# Patient Record
Sex: Female | Born: 1935 | Race: Black or African American | Hispanic: No | Marital: Single | Smoking: Never smoker
Health system: Southern US, Community
[De-identification: ages and names within clinical notes are randomized; demographics above are authoritative.]

## PROBLEM LIST (undated history)

## (undated) DIAGNOSIS — F0150 Vascular dementia without behavioral disturbance: Secondary | ICD-10-CM

## (undated) DIAGNOSIS — I509 Heart failure, unspecified: Secondary | ICD-10-CM

## (undated) DIAGNOSIS — F0390 Unspecified dementia without behavioral disturbance: Secondary | ICD-10-CM

## (undated) DIAGNOSIS — I10 Essential (primary) hypertension: Secondary | ICD-10-CM

## (undated) DIAGNOSIS — F419 Anxiety disorder, unspecified: Secondary | ICD-10-CM

## (undated) DIAGNOSIS — F32A Depression, unspecified: Secondary | ICD-10-CM

## (undated) DIAGNOSIS — K219 Gastro-esophageal reflux disease without esophagitis: Secondary | ICD-10-CM

## (undated) DIAGNOSIS — U071 COVID-19: Secondary | ICD-10-CM

## (undated) HISTORY — DX: Depression, unspecified: F32.A

## (undated) HISTORY — DX: COVID-19: U07.1

## (undated) HISTORY — DX: Anxiety disorder, unspecified: F41.9

## (undated) HISTORY — DX: Gastro-esophageal reflux disease without esophagitis: K21.9

## (undated) HISTORY — DX: Vascular dementia, unspecified severity, without behavioral disturbance, psychotic disturbance, mood disturbance, and anxiety: F01.50

## (undated) HISTORY — DX: Essential (primary) hypertension: I10

## (undated) HISTORY — DX: Heart failure, unspecified: I50.9

## (undated) HISTORY — DX: Unspecified dementia, unspecified severity, without behavioral disturbance, psychotic disturbance, mood disturbance, and anxiety: F03.90

---

## 2020-07-04 ENCOUNTER — Emergency Department (HOSPITAL_COMMUNITY): Payer: Medicare Other

## 2020-07-04 ENCOUNTER — Emergency Department (HOSPITAL_COMMUNITY)
Admission: EM | Admit: 2020-07-04 | Discharge: 2020-07-04 | Disposition: A | Discharge status: Home / Self Care | Payer: Medicare Other | Attending: Emergency Medicine | Admitting: Emergency Medicine

## 2020-07-04 ENCOUNTER — Encounter (HOSPITAL_COMMUNITY): Payer: Self-pay | Admitting: Emergency Medicine

## 2020-07-04 DIAGNOSIS — I509 Heart failure, unspecified: Secondary | ICD-10-CM | POA: Diagnosis not present

## 2020-07-04 DIAGNOSIS — I110 Hypertensive heart disease with heart failure: Secondary | ICD-10-CM | POA: Insufficient documentation

## 2020-07-04 DIAGNOSIS — W19XXXA Unspecified fall, initial encounter: Secondary | ICD-10-CM | POA: Diagnosis not present

## 2020-07-04 DIAGNOSIS — F0390 Unspecified dementia without behavioral disturbance: Secondary | ICD-10-CM | POA: Diagnosis not present

## 2020-07-04 DIAGNOSIS — M25552 Pain in left hip: Secondary | ICD-10-CM | POA: Insufficient documentation

## 2020-07-04 HISTORY — DX: Unspecified dementia, unspecified severity, without behavioral disturbance, psychotic disturbance, mood disturbance, and anxiety: F03.90

## 2020-07-04 HISTORY — DX: Essential (primary) hypertension: I10

## 2020-07-04 HISTORY — DX: Gastro-esophageal reflux disease without esophagitis: K21.9

## 2020-07-04 HISTORY — DX: Heart failure, unspecified: I50.9

## 2020-07-04 HISTORY — DX: Anxiety disorder, unspecified: F41.9

## 2020-07-04 NOTE — ED Notes (Addendum)
Pt assisted to standing position with 2 person assist, pt is able to bare weight without pain. Pt assisted back to bed.

## 2020-07-04 NOTE — ED Notes (Signed)
Called [facility] for transport to [facility]

## 2020-07-04 NOTE — ED Notes (Signed)
Staff at [facility] verbalize understanding of d/c instructions and follow up if needed, pt transported back to facility via [facility]

## 2020-07-04 NOTE — ED Triage Notes (Signed)
Pt sent by [facility] from [facility] after witnessed fall, no obvious injuries, c/o no pain. Staff request patient be sent to ED for evaluation.

## 2020-07-04 NOTE — ED Notes (Signed)
[facility] at bedside 

## 2020-07-04 NOTE — ED Notes (Signed)
Report called to [facility], transportation requested

## 2020-07-04 NOTE — ED Provider Notes (Signed)
[facility] EMERGENCY DEPARTMENT Provider Note   CSN: [id number] Arrival date & time: 07/04/20  0008     History Chief Complaint  Patient presents with  . Fall   Level 5 caveat due to dementia Dalores Weger is a 86 y.o. female.  The history is provided by the patient.  Fall This is a new problem. The problem occurs constantly. The problem has not changed since onset.Nothing aggravates the symptoms. Nothing relieves the symptoms.   Patient with history of dementia, CHF presents from nursing home after fall.  Per nursing staff, they heard a loud boom and found the patient on the floor.  I suspect she was try to get to the bathroom.  No obvious signs of head injury.  Patient was crying and reporting left hip pain.  By the time EMS arrived, patient was improved. Patient denies any complaints at this time.    Past Medical History:  Diagnosis Date  . Anxiety   . CHF (congestive heart failure) (HCC)   . Dementia (HCC)   . GERD (gastroesophageal reflux disease)   . Hypertension     There are no problems to display for this patient.   History reviewed. No pertinent surgical history.   OB History   No obstetric history on file.     No family history on file.  Social History   Tobacco Use  . Smoking status: Never Smoker  . Smokeless tobacco: Never Used  Substance Use Topics  . Alcohol use: Not Currently  . Drug use: Never    Home Medications Prior to Admission medications   Not on File    Allergies    Patient has no known allergies.  Review of Systems   Review of Systems  Unable to perform ROS: Dementia    Physical Exam Updated Vital Signs BP (!) 142/72 (BP Location: Left Arm)   Pulse 84   Temp (!) 97.3 F (36.3 C) (Tympanic)   Resp 18   SpO2 97%   Physical Exam CONSTITUTIONAL: Elderly but appears younger than stated age HEAD: Normocephalic/atraumatic, no visible trauma EYES: EOMI/PERRL ENMT: Mucous membranes moist NECK:  supple no meningeal signs SPINE/BACK:entire spine nontender CV: S1/S2 noted, no murmurs/rubs/gallops noted LUNGS: Lungs are clear to auscultation bilaterally, no apparent distress ABDOMEN: soft, nontender NEURO: Pt is awake/alert, moves all extremitiesx4.  No facial droop.   EXTREMITIES: pulses normal/equal, full ROM, pelvis stable, all other extremities/joints palpated/ranged and nontender SKIN: warm, color normal PSYCH: no abnormalities of mood noted, alert and oriented to situation  ED Results / Procedures / Treatments   Labs (all labs ordered are listed, but only abnormal results are displayed) Labs Reviewed - No data to display  EKG None  Radiology DG Hip [place] Pelvis 1 View Left  Result Date: 07/04/2020 CLINICAL DATA:  Initial evaluation for acute pain status post fall. EXAM: DG HIP (WITH OR WITHOUT PELVIS) 1V PORT LEFT COMPARISON:  Prior CT from 02/02/2020. FINDINGS: Apparent shortening of the left femoral neck, relatively stable from prior CT. No visible acute fracture or dislocation. Mild osteoarthritic changes about the left hip. Visualized bony pelvis intact. No visible soft tissue injury. Probable multiple calcified fibroids overlie the pelvis. IMPRESSION: No radiographic evidence for acute traumatic injury about the left hip. Electronically Signed   By: Rise Mu M.D.   On: 07/04/2020 01:40    Procedures Procedures  Medications Ordered in ED Medications - No data to display  ED Course  I have reviewed  the triage vital signs and the nursing notes.  Pertinent  imaging results that were available during my care of the patient were reviewed by me and considered in my medical decision making (see chart for details).    MDM Rules/Calculators/A&P                          Discussed with nursing staff at nursing home.  Plan to get x-ray of left hip, and if negative can be discharged back. they have attempted to contact family 1:49 AM X-ray  reviewed and shows no acute fracture.  Patient has been able to stand without pain. No other signs of acute traumatic injury.  Patient will be discharged back to facility Final Clinical Impression(s) / ED Diagnoses Final diagnoses:  Fall, initial encounter  Left hip pain    Rx / DC Orders ED Discharge Orders    None       Zadie Rhine, MD 07/04/20 0149

## 2020-11-15 NOTE — Progress Notes (Addendum)
Cardiology Office Note:    Date:  11/25/2020   ID:  Monique Petersen, DOB 10/01/1915, MRN [id number]  PCP:  Charlynne Pander, MD   [facility]  Cardiologist:  Christell Constant, MD  Advanced Practice Provider:  No care team member to display Electrophysiologist:  None       CC: CHF- NOS  Consulted for the evaluation of CHF at the behest of Charlynne Pander, MD  History of Present Illness:    Monique Petersen is a 87 y.o. female with a hx of HTN, CHF nos, and Vascular Dementia who presents for evaluation 11/26/20.  Patient notes that she is feeling terrible: through her exam she notes the following:  She is looking for her daughter who brought her and is worried about her (reference to her daughter [facility]; no one present for exam, transport done through [facility]), that she is upset that one of the men (presumed PT) has made her get up and down and do exercising when all she wants to do is sit.  Is alert to person and place; though needs re-orienting.  Denies CP/SOB; most other ROS leads back to the above.  Called Daughter and left message re-iterating her concerns and letting her know we were there. Reached son Marti Sleigh Page:  Notes that she has breathing problems; that based on prolonged family discussions, that her daughter Monique Petersen would make medical decisions for Ms. [name].  Records reviewed:Patient has a hx of vascular dementia and systolic HF NOS.  Has had persistent elevation in her BNP 600-> 800-> 1200 and has had a increase in her lasix:  From 40 mg PO BID-> 80 mg PO BID.  Unclear if patient is presently on lasix 120 PO BID at her facility.   Past Medical History:  Diagnosis Date  . Anxiety   . Anxiety   . CHF (congestive heart failure) (HCC)   . COVID-19   . Dementia (HCC)   . Depression   . GERD (gastroesophageal reflux disease)   . Hypertension   . Vascular dementia, uncomplicated (HCC)     No past surgical history on file.  Current  Medications: Current Meds  Medication Sig  . furosemide (LASIX) 80 MG tablet Take 80 mg by mouth.  Marland Kitchen LORazepam (ATIVAN) 0.5 MG tablet Take 0.5 mg by mouth every 8 (eight) hours.  . potassium chloride SA (KLOR-CON) 20 MEQ tablet Take 20 mEq by mouth 2 (two) times daily.  . tamsulosin (FLOMAX) 0.4 MG CAPS capsule Take 0.4 mg by mouth daily.  Marland Kitchen torsemide (DEMADEX) 100 MG tablet Take 100 mg by mouth daily.     Allergies:   Patient has no known allergies.   Social History   Socioeconomic History  . Marital status: Single    Spouse name: Not on file  . Number of children: Not on file  . Years of education: Not on file  . Highest education level: Not on file  Occupational History  . Not on file  Tobacco Use  . Smoking status: Never Smoker  . Smokeless tobacco: Never Used  Substance and Sexual Activity  . Alcohol use: Not Currently  . Drug use: Never  . Sexual activity: Not on file  Other Topics Concern  . Not on file  Social History Narrative  . Not on file   Social Determinants of Health   Financial Resource Strain: Not on file  Food Insecurity: Not on file  Transportation Needs: Not on file  Physical Activity: Not on file  Stress: Not on file  Social Connections: Not on file     Family History: Unable to obtain- patient has significant dementia and records do not address  ROS:   Please see the history of present illness.     All other systems reviewed and are negative thought with limited understanding by patient.  EKGs/Labs/Other Studies Reviewed:    The following studies were reviewed today:  EKG:  EKG is  ordered today.  The ekg ordered today demonstrates  11/25/20: SR rate 93 LVH IVCD  Recent Labs: No results found for requested labs within last 8760 hours.  Recent Lipid Panel No results found for: CHOL, TRIG, HDL, CHOLHDL, VLDL, LDLCALC, LDLDIRECT   Risk Assessment/Calculations:     N/A  Physical Exam:    VS:  BP 110/69   Pulse 93   Ht 5\' 3"  (1.6  m)   SpO2 99%     Wt Readings from Last 3 Encounters:  No data found for Wt     GEN: Then female, elderly in no acute distress HEENT: Normal; has false teeth and frequently takes them out during exam NECK: No JVD LYMPHATICS: No lymphadenopathy CARDIAC: RRR, soft I/VI systolic murmur, no rubs, gallops RESPIRATORY:  Clear to auscultation without rales, wheezing or rhonchi  ABDOMEN: Soft, non-tender, non-distended MUSCULOSKELETAL:  Non pitting edema; No deformity  SKIN: Warm and dry NEUROLOGIC:  Alert and oriented person, occasional to place through exam PSYCHIATRIC:  Anxious affect   ASSESSMENT:    1. Essential hypertension   2. HFrEF (heart failure with reduced ejection fraction) (HCC)   3. Vascular dementia without behavioral disturbance (HCC)    PLAN:    In order of problems listed above:  Heart Failure Reduced Ejection Fraction NOS Vascular Dementia HTN - keeping her on lasix 120 mg PO BID (the does we believe she is on) appears to keep her euvolemic - would check BMP, Mg on this regimen to make sure her kidney WNL - we have reached out to her facility to clarify these medications and are awaiting a call back - will not repeat echocardiogram as we have no plans to pursue aggressive therapy given age - If HTN becomes an issues, low threshold for ARB or Coreg - we have asked her facility if they have other clear past records we may be able to use for her assessment  Addendum: In internal on lasix 120 mg PO BID BNP 814, Creatinine 0.93, Potassium 4.1     Time Spent Directly with Patient:   I have spent a total of 90 with the patient reviewing notes, EKGs, labs and examining the patient as well as establishing an assessment and plan that was discussed personally with the patient.  > 50% of time was spent in direct patient care and family. - called daughter - called son Monique Petersen, on speaker phone with patient, and discussed brief plan (to keep her euvolemic) -  patient was unaccompanied to visit and would try to leave throughout visit; arrange safe monitoring while at [facility]:  Patient will need to be accompanied for future visits - reached out to [facility] as above  Three months follow up unless new symptoms or abnormal test results warranting change in plan  Would be reasonable for  APP Follow up            Medication Adjustments/Labs and Tests Ordered: Current medicines are reviewed at length with the patient today.  Concerns regarding medicines are outlined above.  No orders of the  defined types were placed in this encounter.  No orders of the defined types were placed in this encounter.   Patient Instructions  Medication Instructions:  Your physician recommends that you continue on your current medications as directed. Please refer to the Current Medication list given to you today.  *If you need a refill on your cardiac medications before your next appointment, please call your pharmacy*   Lab Work: BNP and BMP in 7-10 days please fax results to Dr. Izora Ribas [phone] If you have labs (blood work) drawn today and your tests are completely normal, you will receive your results only by: Marland Kitchen MyChart Message (if you have MyChart) OR . A paper copy in the mail If you have any lab test that is abnormal or we need to change your treatment, we will call you to review the results.   Testing/Procedures: NONE   Follow-Up: At [facility], you and your health needs are our priority.  As part of our continuing mission to provide you with exceptional heart care, we have created designated Provider Care Teams.  These Care Teams include your primary Cardiologist (physician) and Advanced Practice Providers (APPs -  Physician Assistants and Nurse Practitioners) who all work together to provide you with the care you need, when you need it.  We recommend signing up for the patient portal called "MyChart".  Sign up information is provided  on this After Visit Summary.  MyChart is used to connect with patients for Virtual Visits (Telemedicine).  Patients are able to view lab/test results, encounter notes, upcoming appointments, etc.  Non-urgent messages can be sent to your provider as well.   To learn more about what you can do with MyChart, go to [email or web address].    Your next appointment:   3-4 month(s)  PLEASE HAVE SOMEONE ACCOMPANY **  The format for your next appointment:   In Person  Provider:   You may see Christell Constant, MD or one of the following Advanced Practice Providers on your designated Care Team:    Ronie Spies, PA-C  Jacolyn Reedy, PA-C         Signed, Christell Constant, MD  11/25/2020 12:43 PM    [facility]

## 2020-11-25 ENCOUNTER — Encounter: Payer: Self-pay | Admitting: Internal Medicine

## 2020-11-25 ENCOUNTER — Other Ambulatory Visit: Payer: Self-pay

## 2020-11-25 ENCOUNTER — Telehealth: Payer: Self-pay

## 2020-11-25 ENCOUNTER — Ambulatory Visit (INDEPENDENT_AMBULATORY_CARE_PROVIDER_SITE_OTHER): Payer: Medicare Other | Admitting: Internal Medicine

## 2020-11-25 VITALS — BP 110/69 | HR 93 | Ht 63.0 in

## 2020-11-25 DIAGNOSIS — F0150 Vascular dementia without behavioral disturbance: Secondary | ICD-10-CM

## 2020-11-25 DIAGNOSIS — I5020 Unspecified systolic (congestive) heart failure: Secondary | ICD-10-CM | POA: Diagnosis not present

## 2020-11-25 DIAGNOSIS — I10 Essential (primary) hypertension: Secondary | ICD-10-CM

## 2020-11-25 NOTE — Telephone Encounter (Signed)
Called facility to get clarification on medications taken.  Spoke with nurse April, she expressed that she was having computer issues and could not give me information needed. She would have someone call me back.  I also informed her to put in patient chart that she is not to come to appointments alone d/t age and cognition.  Patient noted crying asking for daughter and attempting to wander.  Nurse April reported that she would put a note in patient chart.

## 2020-11-25 NOTE — Patient Instructions (Signed)
Medication Instructions:  Your physician recommends that you continue on your current medications as directed. Please refer to the Current Medication list given to you today.  *If you need a refill on your cardiac medications before your next appointment, please call your pharmacy*   Lab Work: BNP and BMP in 7-10 days please fax results to Dr. Izora Ribas [phone] If you have labs (blood work) drawn today and your tests are completely normal, you will receive your results only by: Marland Kitchen MyChart Message (if you have MyChart) OR . A paper copy in the mail If you have any lab test that is abnormal or we need to change your treatment, we will call you to review the results.   Testing/Procedures: NONE   Follow-Up: At [facility], you and your health needs are our priority.  As part of our continuing mission to provide you with exceptional heart care, we have created designated Provider Care Teams.  These Care Teams include your primary Cardiologist (physician) and Advanced Practice Providers (APPs -  Physician Assistants and Nurse Practitioners) who all work together to provide you with the care you need, when you need it.  We recommend signing up for the patient portal called "MyChart".  Sign up information is provided on this After Visit Summary.  MyChart is used to connect with patients for Virtual Visits (Telemedicine).  Patients are able to view lab/test results, encounter notes, upcoming appointments, etc.  Non-urgent messages can be sent to your provider as well.   To learn more about what you can do with MyChart, go to [email or web address].    Your next appointment:   3-4 month(s)  PLEASE HAVE SOMEONE ACCOMPANY **  The format for your next appointment:   In Person  Provider:   You may see Christell Constant, MD or one of the following Advanced Practice Providers on your designated Care Team:    Ronie Spies, PA-C  Jacolyn Reedy, PA-C

## 2020-11-25 NOTE — Telephone Encounter (Signed)
Patient's daughter is returning call.

## 2020-11-26 NOTE — Addendum Note (Signed)
Addended by: Winifred Olive on: 11/26/2020 03:22 PM   Modules accepted: Orders

## 2020-12-20 NOTE — Telephone Encounter (Signed)
Spoke with staff at [facility] in regards to 3 month fu.  The contact person Dennie Bible) for scheduling is out of the office.  I will attempt to call back at later date 8-4pm to schedule appointment.

## 2020-12-21 NOTE — Telephone Encounter (Signed)
Attempted to speak with scheduler Pat for 3 month fu.  However, I was left on hold for greater than 3 minutes will attempt at another date.

## 2021-05-03 NOTE — Telephone Encounter (Signed)
Called Pat scheduler for facility [facility] scheduled for 07/26/21 at 9 am.  Dennie Bible advised that she noted that pt is to have a CNA with her at [facility].

## 2021-06-22 ENCOUNTER — Inpatient Hospital Stay (HOSPITAL_COMMUNITY): Payer: Medicare Other

## 2021-06-22 ENCOUNTER — Inpatient Hospital Stay (HOSPITAL_COMMUNITY)
Admission: EM | Admit: 2021-06-22 | Discharge: 2021-06-26 | DRG: 689 | Disposition: A | Discharge status: Skilled Nursing Facility | Payer: Medicare Other | Source: Skilled Nursing Facility | Attending: Internal Medicine | Admitting: Internal Medicine

## 2021-06-22 ENCOUNTER — Emergency Department (HOSPITAL_COMMUNITY): Payer: Medicare Other

## 2021-06-22 ENCOUNTER — Other Ambulatory Visit: Payer: Self-pay

## 2021-06-22 ENCOUNTER — Encounter (HOSPITAL_COMMUNITY): Payer: Self-pay | Admitting: Internal Medicine

## 2021-06-22 DIAGNOSIS — E870 Hyperosmolality and hypernatremia: Secondary | ICD-10-CM | POA: Diagnosis present

## 2021-06-22 DIAGNOSIS — F0284 Dementia in other diseases classified elsewhere, unspecified severity, with anxiety: Secondary | ICD-10-CM | POA: Diagnosis present

## 2021-06-22 DIAGNOSIS — E872 Acidosis, unspecified: Secondary | ICD-10-CM | POA: Diagnosis present

## 2021-06-22 DIAGNOSIS — R404 Transient alteration of awareness: Secondary | ICD-10-CM

## 2021-06-22 DIAGNOSIS — K219 Gastro-esophageal reflux disease without esophagitis: Secondary | ICD-10-CM | POA: Diagnosis present

## 2021-06-22 DIAGNOSIS — Z96641 Presence of right artificial hip joint: Secondary | ICD-10-CM | POA: Diagnosis present

## 2021-06-22 DIAGNOSIS — N179 Acute kidney failure, unspecified: Secondary | ICD-10-CM | POA: Diagnosis present

## 2021-06-22 DIAGNOSIS — N3000 Acute cystitis without hematuria: Secondary | ICD-10-CM | POA: Diagnosis present

## 2021-06-22 DIAGNOSIS — F32A Depression, unspecified: Secondary | ICD-10-CM | POA: Diagnosis present

## 2021-06-22 DIAGNOSIS — I110 Hypertensive heart disease with heart failure: Secondary | ICD-10-CM | POA: Diagnosis present

## 2021-06-22 DIAGNOSIS — I509 Heart failure, unspecified: Secondary | ICD-10-CM

## 2021-06-22 DIAGNOSIS — Z515 Encounter for palliative care: Secondary | ICD-10-CM | POA: Diagnosis not present

## 2021-06-22 DIAGNOSIS — F0154 Vascular dementia, unspecified severity, with anxiety: Secondary | ICD-10-CM | POA: Diagnosis present

## 2021-06-22 DIAGNOSIS — R54 Age-related physical debility: Secondary | ICD-10-CM | POA: Diagnosis present

## 2021-06-22 DIAGNOSIS — I5022 Chronic systolic (congestive) heart failure: Secondary | ICD-10-CM | POA: Diagnosis present

## 2021-06-22 DIAGNOSIS — E860 Dehydration: Secondary | ICD-10-CM | POA: Diagnosis present

## 2021-06-22 DIAGNOSIS — N390 Urinary tract infection, site not specified: Secondary | ICD-10-CM | POA: Diagnosis present

## 2021-06-22 DIAGNOSIS — G309 Alzheimer's disease, unspecified: Secondary | ICD-10-CM | POA: Diagnosis present

## 2021-06-22 DIAGNOSIS — G9341 Metabolic encephalopathy: Secondary | ICD-10-CM | POA: Diagnosis present

## 2021-06-22 DIAGNOSIS — Z7189 Other specified counseling: Secondary | ICD-10-CM | POA: Diagnosis not present

## 2021-06-22 DIAGNOSIS — R410 Disorientation, unspecified: Secondary | ICD-10-CM

## 2021-06-22 DIAGNOSIS — F411 Generalized anxiety disorder: Secondary | ICD-10-CM | POA: Diagnosis present

## 2021-06-22 DIAGNOSIS — F419 Anxiety disorder, unspecified: Secondary | ICD-10-CM | POA: Diagnosis present

## 2021-06-22 DIAGNOSIS — R627 Adult failure to thrive: Secondary | ICD-10-CM | POA: Diagnosis present

## 2021-06-22 DIAGNOSIS — Z66 Do not resuscitate: Secondary | ICD-10-CM | POA: Diagnosis present

## 2021-06-22 DIAGNOSIS — I5020 Unspecified systolic (congestive) heart failure: Secondary | ICD-10-CM | POA: Diagnosis not present

## 2021-06-22 DIAGNOSIS — Z20822 Contact with and (suspected) exposure to covid-19: Secondary | ICD-10-CM | POA: Diagnosis present

## 2021-06-22 LAB — COMPREHENSIVE METABOLIC PANEL
ALT: 24 U/L (ref 0–44)
AST: 20 U/L (ref 15–41)
Albumin: 3.8 g/dL (ref 3.5–5.0)
Alkaline Phosphatase: 80 U/L (ref 38–126)
Anion gap: 14 (ref 5–15)
BUN: 29 mg/dL — ABNORMAL HIGH (ref 8–23)
CO2: 22 mmol/L (ref 22–32)
Calcium: 9.8 mg/dL (ref 8.9–10.3)
Chloride: 106 mmol/L (ref 98–111)
Creatinine, Ser: 1.42 mg/dL — ABNORMAL HIGH (ref 0.44–1.00)
GFR, Estimated: 32 mL/min — ABNORMAL LOW (ref >60)
Glucose, Bld: 135 mg/dL — ABNORMAL HIGH (ref 70–99)
Potassium: 4.1 mmol/L (ref 3.5–5.1)
Sodium: 142 mmol/L (ref 135–145)
Total Bilirubin: 2.1 mg/dL — ABNORMAL HIGH (ref 0.3–1.2)
Total Protein: 7.9 g/dL (ref 6.5–8.1)

## 2021-06-22 LAB — URINALYSIS, ROUTINE W REFLEX MICROSCOPIC
Bilirubin Urine: NEGATIVE
Glucose, UA: NEGATIVE mg/dL
Ketones, ur: NEGATIVE mg/dL
Nitrite: NEGATIVE
Protein, ur: 30 mg/dL — AB
RBC / HPF: >50 RBC/hpf — ABNORMAL HIGH (ref 0–5)
Specific Gravity, Urine: 1.015 (ref 1.005–1.030)
WBC, UA: >50 WBC/hpf — ABNORMAL HIGH (ref 0–5)
pH: 5 (ref 5.0–8.0)

## 2021-06-22 LAB — CBC WITH DIFFERENTIAL/PLATELET
Abs Immature Granulocytes: 0.02 10*3/uL (ref 0.00–0.07)
Basophils Absolute: 0.1 10*3/uL (ref 0.0–0.1)
Basophils Relative: 1 %
Eosinophils Absolute: 0 10*3/uL (ref 0.0–0.5)
Eosinophils Relative: 0 %
HCT: 47.3 % — ABNORMAL HIGH (ref 36.0–46.0)
Hemoglobin: 15.6 g/dL — ABNORMAL HIGH (ref 12.0–15.0)
Immature Granulocytes: 0 %
Lymphocytes Relative: 26 %
Lymphs Abs: 1.9 10*3/uL (ref 0.7–4.0)
MCH: 30.7 pg (ref 26.0–34.0)
MCHC: 33 g/dL (ref 30.0–36.0)
MCV: 93.1 fL (ref 80.0–100.0)
Monocytes Absolute: 0.7 10*3/uL (ref 0.1–1.0)
Monocytes Relative: 9 %
Neutro Abs: 4.7 10*3/uL (ref 1.7–7.7)
Neutrophils Relative %: 64 %
Platelets: 214 10*3/uL (ref 150–400)
RBC: 5.08 MIL/uL (ref 3.87–5.11)
RDW: 13 % (ref 11.5–15.5)
WBC: 7.3 10*3/uL (ref 4.0–10.5)
nRBC: 0 % (ref 0.0–0.2)

## 2021-06-22 LAB — I-STAT VENOUS BLOOD GAS, ED
Acid-Base Excess: 2 mmol/L (ref 0.0–2.0)
Bicarbonate: 24.3 mmol/L (ref 20.0–28.0)
Calcium, Ion: 1.13 mmol/L — ABNORMAL LOW (ref 1.15–1.40)
HCT: 48 % — ABNORMAL HIGH (ref 36.0–46.0)
Hemoglobin: 16.3 g/dL — ABNORMAL HIGH (ref 12.0–15.0)
O2 Saturation: 81 %
Potassium: 4.2 mmol/L (ref 3.5–5.1)
Sodium: 144 mmol/L (ref 135–145)
TCO2: 25 mmol/L (ref 22–32)
pCO2, Ven: 31.5 mmHg — ABNORMAL LOW (ref 44.0–60.0)
pH, Ven: 7.495 — ABNORMAL HIGH (ref 7.250–7.430)
pO2, Ven: 41 mmHg (ref 32.0–45.0)

## 2021-06-22 LAB — ETHANOL: Alcohol, Ethyl (B): <10 mg/dL (ref <10)

## 2021-06-22 LAB — RESP PANEL BY RT-PCR (FLU A&B, COVID) ARPGX2
Influenza A by PCR: NEGATIVE
Influenza B by PCR: NEGATIVE
SARS Coronavirus 2 by RT PCR: NEGATIVE

## 2021-06-22 LAB — CBG MONITORING, ED: Glucose-Capillary: 151 mg/dL — ABNORMAL HIGH (ref 70–99)

## 2021-06-22 LAB — AMMONIA: Ammonia: 15 umol/L (ref 9–35)

## 2021-06-22 LAB — LACTIC ACID, PLASMA: Lactic Acid, Venous: 1.9 mmol/L (ref 0.5–1.9)

## 2021-06-22 MED ORDER — MORPHINE SULFATE (PF) 2 MG/ML IV SOLN
2.0000 mg | Freq: Once | INTRAVENOUS | Status: AC
  Administered 2021-06-22: 2 mg via INTRAVENOUS
  Filled 2021-06-22: qty 1

## 2021-06-22 MED ORDER — SODIUM CHLORIDE 0.9 % IV SOLN
1.0000 g | Freq: Once | INTRAVENOUS | Status: AC
  Administered 2021-06-22: 1 g via INTRAVENOUS
  Filled 2021-06-22: qty 10

## 2021-06-22 MED ORDER — SODIUM CHLORIDE 0.9 % IV SOLN
1.0000 g | INTRAVENOUS | Status: DC
  Administered 2021-06-23 – 2021-06-25 (×3): 1 g via INTRAVENOUS
  Filled 2021-06-22 (×3): qty 10

## 2021-06-22 MED ORDER — LACTATED RINGERS IV SOLN: INTRAVENOUS | Status: AC

## 2021-06-22 MED ORDER — SODIUM CHLORIDE 0.9 % IV BOLUS
1000.0000 mL | Freq: Once | INTRAVENOUS | Status: AC
  Administered 2021-06-22: 1000 mL via INTRAVENOUS

## 2021-06-22 MED ORDER — ACETAMINOPHEN 325 MG PO TABS: 650.0000 mg | ORAL_TABLET | Freq: Four times a day (QID) | ORAL | Status: DC | PRN

## 2021-06-22 MED ORDER — ONDANSETRON HCL 4 MG/2ML IJ SOLN
4.0000 mg | Freq: Once | INTRAMUSCULAR | Status: AC
  Administered 2021-06-22: 4 mg via INTRAVENOUS
  Filled 2021-06-22: qty 2

## 2021-06-22 MED ORDER — ACETAMINOPHEN 650 MG RE SUPP: 650.0000 mg | Freq: Four times a day (QID) | RECTAL | Status: DC | PRN

## 2021-06-22 NOTE — H&P (Signed)
History and Physical    PLEASE NOTE THAT DRAGON DICTATION SOFTWARE WAS USED IN THE CONSTRUCTION OF THIS NOTE.   Monique Petersen GYB:[id number] DOB: 31-Jul-1916 DOA: 06/22/2021  PCP: Caprice Renshaw, MD  Patient coming from: nursing home  I have personally briefly reviewed patient's old medical records in [place]  Chief Complaint: altered mental status  HPI: Monique Petersen is a 87 y.o. female with medical history significant for chronic systolic heart failure, GAD, dementia, depression, who is admitted to [facility] on 06/22/2021 with acute metabolic encephalopathy after presenting from SNF to [facility] ED for evaluation of altered mental status.     In the setting of patient's presenting altered mental status, the following history if provided by SNF staff, my discussions with EDP, and via chart review.   Per staff at SNF, patient has exhibited two days of progressive somnolence, diminished interactions, and decline in oral intake, relative to baseline mental / functional status at which patient is reportedly alert and able to feed herself. She carries a diagnosis of vascular dementia.   Per chart review, patient has a history of chronic systolic heart failure, but with overt specific prior echocardiogram results identified. She last say [facility] cardiology in April '22, at which time Dr. Joan Mayans noted, per chart review, a history of chronic systolic heart failure, but also noted no currently available specific prior echo results. Dr. Joan Mayans felt that the patient was relative euvolumeic at time of this encounter in April '22, and made no changes to her reported existing diuretic regimen that consistented of Lasix 120 mg PO BID. It also appears that pt takes Spironolactone. Dr. Joan Mayans also noted pt's chronically elevated BNP values, trending up from 600 to 800 to 1200 leading up to the April 2022 outpatient cards appointment.   She also has a documented history of GAD, for  which she is on scheduled Ativan 0.5 mg PO TID as outpatient. Additional outpatient medications notable for the following: scheduled Seroquel BID and daily Zoloft.     ED Course:  Vital signs in the ED were notable for the following: Afebrile; initial heart rate 101, which decreased into the range of 76-90 following interval IV fluids, as above; blood pressure 95/64 -160/76; respiratory rate 16-22, oxygen saturation 95 to 98% on room air.  Labs were notable for the following: CBG 151; CMP notable for the following: Sodium 142, creatinine 1.42 relative to most recent prior serum creatinine data point of 0.93 on 11/29/2020, BUN to creatinine ratio 28.4, calcium 9.8, albumin 3.8, total bilirubin 2.1, otherwise, liver enzymes found to be within normal limits; pneumonia 15, serum ethanol level less than 10.  CBC notable for white blood cell count 7300.  Lactic acid 1.9.  Urinalysis was associated with a hazy appearing specimen it was notable for greater than 50 white blood cells, moderate leukocyte esterase, no squamous epithelial cells, was positive for hyaline cast, and showed 30 protein.  COVID-19/influenza PCR checked in the ED today sent me negative.  Blood cultures x2 were collected prior to initiation of IV antibiotics.  Imaging and additional notable ED work-up: EKG showed sinus rhythm with PACs, heart rate 94, LVH with interventricular conduction delay, QTC 518 ms, and no evidence of interval T wave changes.  Evaluation of presenting chest x-ray is limited by patient rotation, but within these confines,.  Showed no evidence of acute cardiopulmonary process.  CT head showed no evidence of acute intracranial process.  CT renal stone was ordered by EDP, with result currently  pending.  Of note, EDP attempted to reach multiple family members, including son and daughter, but was unable to contact these individuals or any other individual listed as point of contact for the patient.   While in the ED, the  following were administered just received Rocephin, and 1 L normal saline bolus.  Initially there is concern the patient may be in some discomfort, prompting administration of morphine 2 mg IV x1     Review of Systems: As per HPI otherwise 10 point review of systems negative.   Past Medical History:  Diagnosis Date   Anxiety    Anxiety    CHF (congestive heart failure) ([place])    COVID-19    Dementia (HCC)    Depression    GERD (gastroesophageal reflux disease)    Hypertension    Vascular dementia, uncomplicated ([place])     History reviewed. No pertinent surgical history.  Social History:  reports that she has never smoked. She has never used smokeless tobacco. She reports that she does not currently use alcohol. She reports that she does not use drugs.   No Known Allergies  History reviewed. No pertinent family history.   Prior to Admission medications   Medication Sig Start Date End Date Taking? Authorizing Provider  acetaminophen (TYLENOL) 325 MG tablet Take 650 mg by mouth in the morning and at bedtime.   Yes [provider]  docusate sodium (COLACE) 100 MG capsule Take 100 mg by mouth daily.   Yes [provider]  LORazepam (ATIVAN) 0.5 MG tablet Take 0.5 mg by mouth every 8 (eight) hours.   Yes [provider]  melatonin 3 MG TABS tablet Take 3 mg by mouth at bedtime.   Yes [provider]  potassium chloride SA (KLOR-CON) 20 MEQ tablet Take 20 mEq by mouth 2 (two) times daily.   Yes [provider]  QUEtiapine (SEROQUEL) 50 MG tablet Take 50 mg by mouth 2 (two) times daily.   Yes [provider]  Sennosides 8.6 MG CAPS Take 8.6 mg by mouth daily.   Yes [provider]  sertraline (ZOLOFT) 25 MG tablet Take 25 mg by mouth daily.   Yes [provider]  spironolactone (ALDACTONE) 25 MG tablet Take 25 mg by mouth daily.   Yes [provider]  tamsulosin (FLOMAX) 0.4 MG CAPS capsule Take 0.4 mg by  mouth daily. 08/31/20  Yes [provider]  furosemide (LASIX) 80 MG tablet Take 120 mg by mouth 2 (two) times daily.    [provider]     Objective    Physical Exam: Vitals:   06/22/21 2004 06/22/21 2100 06/22/21 2144 06/22/21 2256  BP: 113/67 116/76 136/69 (!) 98/55  Pulse: 94 90 97 83  Resp: 14 19 (!) 32 (!) 21  Temp:      TempSrc:      SpO2: 98% 96% 95% 95%    General: appears to be stated age; somnolent; briefly opens eyes to verbal stimuli; unable to follow instructions; non-verbal Skin: warm, dry, no rash Head:  AT/[place] Mouth:  Oral mucosa membranes appear dry, normal dentition Neck: supple; trachea midline Heart:  RRR; did not appreciate any M/R/G Lungs: CTAB, did not appreciate any wheezes, rales, or rhonchi Abdomen: + BS; soft, ND Vascular: 2+ pedal pulses b/l; 2+ radial pulses b/l Extremities: no peripheral edema, no muscle wasting Neuro: In the setting of the patient's current mental status and associated inability to follow instructions, unable to perform full neurologic exam  at this time.  As such, assessment of strength, sensation, and cranial nerves is limited at this time. Patient noted to spontaneously move all 4 extremities. No tremors.     Labs on Admission: I have personally reviewed following labs and imaging studies  CBC: Recent Labs  Lab 06/22/21 1945 06/22/21 1959  WBC 7.3  --   NEUTROABS 4.7  --   HGB 15.6* 16.3*  HCT 47.3* 48.0*  MCV 93.1  --   PLT 214  --    Basic Metabolic Panel: Recent Labs  Lab 06/22/21 1945 06/22/21 1959  NA 142 144  K 4.1 4.2  CL 106  --   CO2 22  --   GLUCOSE 135*  --   BUN 29*  --   CREATININE 1.42*  --   CALCIUM 9.8  --    GFR: CrCl cannot be calculated (Unknown ideal weight.). Liver Function Tests: Recent Labs  Lab 06/22/21 1945  AST 20  ALT 24  ALKPHOS 80  BILITOT 2.1*  PROT 7.9  ALBUMIN 3.8   No results for input(s): LIPASE, AMYLASE in the last 168 hours. Recent Labs   Lab 06/22/21 1945  AMMONIA 15   Coagulation Profile: No results for input(s): INR, PROTIME in the last 168 hours. Cardiac Enzymes: No results for input(s): CKTOTAL, CKMB, CKMBINDEX, TROPONINI in the last 168 hours. BNP (last 3 results) No results for input(s): PROBNP in the last 8760 hours. HbA1C: No results for input(s): HGBA1C in the last 72 hours. CBG: Recent Labs  Lab 06/22/21 1923  GLUCAP 151*   Lipid Profile: No results for input(s): CHOL, HDL, LDLCALC, TRIG, CHOLHDL, LDLDIRECT in the last 72 hours. Thyroid Function Tests: No results for input(s): TSH, T4TOTAL, FREET4, T3FREE, THYROIDAB in the last 72 hours. Anemia Panel: No results for input(s): VITAMINB12, FOLATE, FERRITIN, TIBC, IRON, RETICCTPCT in the last 72 hours. Urine analysis:    Component Value Date/Time   COLORURINE AMBER (A) 06/22/2021 2151   APPEARANCEUR HAZY (A) 06/22/2021 2151   LABSPEC 1.015 06/22/2021 2151   PHURINE 5.0 06/22/2021 2151   GLUCOSEU NEGATIVE 06/22/2021 2151   HGBUR LARGE (A) 06/22/2021 2151   BILIRUBINUR NEGATIVE 06/22/2021 2151   [place] 06/22/2021 2151   PROTEINUR 30 (A) 06/22/2021 2151   NITRITE NEGATIVE 06/22/2021 2151   LEUKOCYTESUR MODERATE (A) 06/22/2021 2151    Radiological Exams on Admission: DG Chest 1 View  Result Date: 06/22/2021 CLINICAL DATA:  Not eating for 2 days. EXAM: CHEST  1 VIEW COMPARISON:  Chest x-ray 09/02/2019. FINDINGS: Examination is limited secondary to patient rotation. The cardiomediastinal silhouette is grossly unchanged. The heart is enlarged the aorta is tortuous. There is no lung infiltrate, pleural effusion or pneumothorax. No acute fractures are seen. IMPRESSION: 1. Limited examination.  No definite acute cardiopulmonary process. Electronically Signed   By: Ronney Asters M.D.   On: 06/22/2021 19:18   CT HEAD WO CONTRAST  Result Date: 06/22/2021 CLINICAL DATA:  Delirium EXAM: CT HEAD WITHOUT CONTRAST TECHNIQUE: Contiguous axial images  were obtained from the base of the skull through the vertex without intravenous contrast. COMPARISON:  None. FINDINGS: Brain: Image quality degraded by significant motion. Moderate atrophy. Chronic microvascular ischemic change in the white matter and right basal ganglia. Negative for acute infarct, hemorrhage, mass Vascular: Negative for hyperdense vessel Skull: Negative Sinuses/Orbits: Negative Other: None IMPRESSION: Motion degraded study.  No acute abnormality Atrophy and chronic ischemia.  Chronic infarct right basal ganglia. Electronically Signed   By: Franchot Gallo M.D.  On: 06/22/2021 19:24     EKG: Independently reviewed, with result as described above.    Assessment/Plan   Principal Problem:   Acute metabolic encephalopathy Active Problems:   HFrEF (heart failure with reduced ejection fraction) (HCC)   Acute lower UTI   AKI (acute kidney injury) ([place])   Anxiety     #) Acute metabolic encephalopathy: 2 days of progressive somnolence, diminished interaction relative to her reported baseline in the context of underlying dementia at which she is reportedly alert and able to feed herself. Suspect that acute encephalopathy is metabolic in nature in setting of suspected UTI, as further detailed below, with additional metabolic contributions from ensuing relative dehydration resulting in AKI, confounding the presence of multiple scheduled central-acting medications as an outpatient. No overt additional underlying infxn at this time, although review of CXR was somewhat limited by pt rotation. Will further evaluate with procalcitonin. CT head showed no acute process.   Plan: further eval/managaement of UTI, as below, including Rocephin. Additional management of aki and suspected dehydration via very gentle IVF's in form of LR at 50 cc/hr x 8 hours. Keep npo until mental status improves such that patient can participate in and pass nursing bedside swallow screen, including continued holding of  outpatient scheduled central-acting meds. Check cpk, tsh, mma, uds. CMP/CBC in the AM. Follow for result fo CT renal stone ordered by EDP, with result currently pending.     #) Acute cystitis: in setting of presenting acute encephalopathy, presenting UA appears consistent with UTI. Without fever or leukocytosis, SIRS criteria not met for sepsis. LA non-elevated at 1.9. Blood cx's x 2 collected prior to receiving dose of Rocephin. CT renal stone , as above, is currently pending.   Plan: follow for results of blood cx's x2. Add-on urine cx to existing urine specimen. Continue Rocephin. Repeat CBC in the AM.       #) AKI: presenting Cr 1.42 relative to most recent prior value of 0.93 on 11/29/20. Appears prerenal in nature due to relative dehydration given diminished PO intake over last 48 hours, which appears c/w UA results that are notable for presence of hyaline casts as well as 30 protein.   Plan: gentle IVF in form of LR 50 cc/hr x 8 hr, while closing monitoring for evidence of volume overload given reported history of chronic diastolic HF. Monitor strict I&O's, daily weights. Attempt to avoid nephrotoxic agents. Add-on random urine Na/Cr. Bmp in the AM. Follow for cpk result.       #) GAD: on scheduled Ativan as outpatient. In setting of presenting acute encephalopathy, will hold outpatient Ativan for now. Also on Zoloft as outpatient.   Plan: hold home Zoloft and scheduled Ativan for now.       #) chronic systolic heart failure: per chart review, has a history of such, but without specific overt prior echo results identified or alluded to, including per review of most recent outpatient cardiology note from April '22, as described above. Outpatient diuretic regimen rerpotedly consists of Lasix 120 mg PO BID plus spirolonactone. Clinically, suspected to be mildly dehydrated at present following reduced PO intake over last 48 hours, with CXR showing no acute process, within confines of  suboptimal image quality, as described above. Will further assess with BNP, noting chronic elevation of such, as quantified above.   Plan: monitor strict I&O's, daily weights. Check bnp. Add-on serum mag level. Bmp in the AM. Holding home diuretics for now.      DVT prophylaxis: SCD's  Code Status: Full code: per MOST form, patient is full code, but would not want to be intubated. Family Communication: attempts made to reach family unsuccessful , as above Disposition Plan: Per Rounding Team Consults called: none;  Admission status: inpatient; pcu  warrants inpatient status in the setting of acute metabolic encephalopathy, requiring further evaluation and management, including provision of IV antibiotics for suspected presenting urinary tract infection and need for close monitoring of ensuing volume status given plan for gentle IV fluids in the setting of complicating factors that include a documented history of chronic systolic heart failure.   PLEASE NOTE THAT DRAGON DICTATION SOFTWARE WAS USED IN THE CONSTRUCTION OF THIS NOTE.   [place] DO [facility]  From [place]   06/22/2021, 11:09 PM

## 2021-06-22 NOTE — ED Triage Notes (Signed)
Pt BIB EMS bc pt has not been eating for 2 days per nursing home. Pt usually is more alert than what she currently is.

## 2021-06-22 NOTE — ED Provider Notes (Signed)
[facility] EMERGENCY DEPARTMENT Provider Note   CSN: [id number] Arrival date & time: 06/22/21  1828     History Chief Complaint  Patient presents with   Failure To Thrive    Monique Petersen is a 87 y.o. female.  87 yo F with a chief complaints of altered mental status.  Per the nursing home she has not been eating like she normally does for the past couple days and she is less alert than typical.  Not taking her medications.  Level 5 caveat altered mental status.       Past Medical History:  Diagnosis Date   Anxiety    Anxiety    CHF (congestive heart failure) (HCC)    COVID-19    Dementia (HCC)    Depression    GERD (gastroesophageal reflux disease)    Hypertension    Vascular dementia, uncomplicated (HCC)     Patient Active Problem List   Diagnosis Date Noted   Essential hypertension 11/25/2020   HFrEF (heart failure with reduced ejection fraction) (HCC) 11/25/2020   Vascular dementia without behavioral disturbance (HCC) 11/25/2020    No past surgical history on file.   OB History   No obstetric history on file.     No family history on file.  Social History   Tobacco Use   Smoking status: Never   Smokeless tobacco: Never  Substance Use Topics   Alcohol use: Not Currently   Drug use: Never    Home Medications Prior to Admission medications   Medication Sig Start Date End Date Taking? Authorizing Provider  furosemide (LASIX) 80 MG tablet Take 80 mg by mouth.    [provider]  LORazepam (ATIVAN) 0.5 MG tablet Take 0.5 mg by mouth every 8 (eight) hours.    [provider]  potassium chloride SA (KLOR-CON) 20 MEQ tablet Take 20 mEq by mouth 2 (two) times daily.    [provider]  tamsulosin (FLOMAX) 0.4 MG CAPS capsule Take 0.4 mg by mouth daily. 08/31/20   [provider]  torsemide (DEMADEX) 100 MG tablet Take 100 mg by mouth daily.    [provider]    Allergies    Patient  has no known allergies.  Review of Systems   Review of Systems  Unable to perform ROS: Mental status change   Physical Exam Updated Vital Signs BP 99/66   Pulse 95   Temp 97.9 F (36.6 C) (Oral)   Resp 16   SpO2 98%   Physical Exam Vitals and nursing note reviewed.  Constitutional:      General: She is not in acute distress.    Appearance: She is well-developed. She is not diaphoretic.  HENT:     Head: Normocephalic and atraumatic.  Eyes:     Pupils: Pupils are equal, round, and reactive to light.  Cardiovascular:     Rate and Rhythm: Regular rhythm. Tachycardia present.     Heart sounds: No murmur heard.   No friction rub. No gallop.  Pulmonary:     Effort: Pulmonary effort is normal.     Breath sounds: No wheezing or rales.     Comments: Tachypnea Abdominal:     General: There is no distension.     Palpations: Abdomen is soft.     Tenderness: There is no abdominal tenderness.  Musculoskeletal:        General: No tenderness.     Cervical back: Normal range of motion and neck supple.  Skin:  General: Skin is warm and dry.  Neurological:     Comments: Keeps her eyes closed.  Moans.  No obvious focal area of pain on palpation.  Psychiatric:        Behavior: Behavior normal.    ED Results / Procedures / Treatments   Labs (all labs ordered are listed, but only abnormal results are displayed) Labs Reviewed  CULTURE, BLOOD (ROUTINE X 2)  CULTURE, BLOOD (ROUTINE X 2)  RESP PANEL BY RT-PCR (FLU A&B, COVID) ARPGX2  AMMONIA  COMPREHENSIVE METABOLIC PANEL  LACTIC ACID, PLASMA  ETHANOL  CBC WITH DIFFERENTIAL/PLATELET  URINALYSIS, ROUTINE W REFLEX MICROSCOPIC  CBG MONITORING, ED    EKG None  Radiology No results found.  Procedures Procedures   Medications Ordered in ED Medications  sodium chloride 0.9 % bolus 1,000 mL (has no administration in time range)    ED Course  I have reviewed the triage vital signs and the nursing notes.  Pertinent labs &  imaging results that were available during my care of the patient were reviewed by me and considered in my medical decision making (see chart for details).    MDM Rules/Calculators/A&P                           87 yo F with a chief complaints of altered mental status.  Not eating and drinking for the past couple days.  Patient has a MOST form that indicates she would want CPR but not intubation or ICU admission.  I have tried to contact the family but have been unable x3.  We will search for cause of her altered mental status.  CT of the head chest x-ray UA blood work bolus of IV fluids reassess.  CT scan of the head without obvious pathology.  Chest x-ray patient rotated significantly makes it difficult to read possible right lower lobe infiltrates with obscuration of the heart border but not obvious read as radiology negative.  UA with large white cells but rare bacteria.  Hematuria.  Could be traumatic due to In-N-Out cath as a way to obtain urine we will obtain a stone study.  Will discuss with medicine for possible admission.  The patients results and plan were reviewed and discussed.   Any x-rays performed were independently reviewed by myself.   Differential diagnosis were considered with the presenting HPI.  Medications  acetaminophen (TYLENOL) tablet 650 mg (has no administration in time range)    Or  acetaminophen (TYLENOL) suppository 650 mg (has no administration in time range)  cefTRIAXone (ROCEPHIN) 1 g in sodium chloride 0.9 % 100 mL IVPB (has no administration in time range)  lactated ringers infusion ( Intravenous Infusion Verify 06/23/21 0300)  sodium chloride 0.9 % bolus 1,000 mL (0 mLs Intravenous Stopped 06/22/21 2256)  morphine 2 MG/ML injection 2 mg (2 mg Intravenous Given 06/22/21 2002)  ondansetron (ZOFRAN) injection 4 mg (4 mg Intravenous Given 06/22/21 2001)  cefTRIAXone (ROCEPHIN) 1 g in sodium chloride 0.9 % 100 mL IVPB (0 g Intravenous Stopped 06/22/21 2356)     Vitals:   06/23/21 0435 06/23/21 0500 06/23/21 0755 06/23/21 1147  BP: 108/67  (!) 100/50 97/64  Pulse: 73  68 81  Resp: 20  16 16   Temp: 97.8 F (36.6 C)  98.1 F (36.7 C) 98.5 F (36.9 C)  TempSrc: Oral  Oral Axillary  SpO2: 99%  100% 94%  Weight:  67.3 kg      Final diagnoses:  Delirium    Admission/ observation were discussed with the admitting physician, patient and/or family and they are comfortable with the plan.    Final Clinical Impression(s) / ED Diagnoses Final diagnoses:  None    Rx / DC Orders ED Discharge Orders     None        Deno Etienne, DO 06/23/21 1506

## 2021-06-23 DIAGNOSIS — G9341 Metabolic encephalopathy: Secondary | ICD-10-CM

## 2021-06-23 DIAGNOSIS — Z515 Encounter for palliative care: Secondary | ICD-10-CM

## 2021-06-23 DIAGNOSIS — Z7189 Other specified counseling: Secondary | ICD-10-CM

## 2021-06-23 LAB — CBC WITH DIFFERENTIAL/PLATELET
Abs Immature Granulocytes: 0.02 10*3/uL (ref 0.00–0.07)
Basophils Absolute: 0 10*3/uL (ref 0.0–0.1)
Basophils Relative: 1 %
Eosinophils Absolute: 0 10*3/uL (ref 0.0–0.5)
Eosinophils Relative: 0 %
HCT: 43.5 % (ref 36.0–46.0)
Hemoglobin: 14.2 g/dL (ref 12.0–15.0)
Immature Granulocytes: 0 %
Lymphocytes Relative: 22 %
Lymphs Abs: 1.4 10*3/uL (ref 0.7–4.0)
MCH: 30.7 pg (ref 26.0–34.0)
MCHC: 32.6 g/dL (ref 30.0–36.0)
MCV: 94 fL (ref 80.0–100.0)
Monocytes Absolute: 0.7 10*3/uL (ref 0.1–1.0)
Monocytes Relative: 10 %
Neutro Abs: 4.2 10*3/uL (ref 1.7–7.7)
Neutrophils Relative %: 67 %
Platelets: 167 10*3/uL (ref 150–400)
RBC: 4.63 MIL/uL (ref 3.87–5.11)
RDW: 13 % (ref 11.5–15.5)
WBC: 6.3 10*3/uL (ref 4.0–10.5)
nRBC: 0 % (ref 0.0–0.2)

## 2021-06-23 LAB — COMPREHENSIVE METABOLIC PANEL
ALT: 19 U/L (ref 0–44)
AST: 16 U/L (ref 15–41)
Albumin: 3.1 g/dL — ABNORMAL LOW (ref 3.5–5.0)
Alkaline Phosphatase: 67 U/L (ref 38–126)
Anion gap: 13 (ref 5–15)
BUN: 29 mg/dL — ABNORMAL HIGH (ref 8–23)
CO2: 19 mmol/L — ABNORMAL LOW (ref 22–32)
Calcium: 9.2 mg/dL (ref 8.9–10.3)
Chloride: 112 mmol/L — ABNORMAL HIGH (ref 98–111)
Creatinine, Ser: 1.29 mg/dL — ABNORMAL HIGH (ref 0.44–1.00)
GFR, Estimated: 36 mL/min — ABNORMAL LOW (ref >60)
Glucose, Bld: 109 mg/dL — ABNORMAL HIGH (ref 70–99)
Potassium: 4 mmol/L (ref 3.5–5.1)
Sodium: 144 mmol/L (ref 135–145)
Total Bilirubin: 1.6 mg/dL — ABNORMAL HIGH (ref 0.3–1.2)
Total Protein: 6.6 g/dL (ref 6.5–8.1)

## 2021-06-23 LAB — RAPID URINE DRUG SCREEN, HOSP PERFORMED
Amphetamines: NOT DETECTED
Barbiturates: NOT DETECTED
Benzodiazepines: POSITIVE — AB
Cocaine: NOT DETECTED
Opiates: POSITIVE — AB
Tetrahydrocannabinol: NOT DETECTED

## 2021-06-23 LAB — CK: Total CK: 92 U/L (ref 38–234)

## 2021-06-23 LAB — MAGNESIUM: Magnesium: 2.4 mg/dL (ref 1.7–2.4)

## 2021-06-23 LAB — PROCALCITONIN: Procalcitonin: <0.1 ng/mL

## 2021-06-23 LAB — BRAIN NATRIURETIC PEPTIDE: B Natriuretic Peptide: 1681.9 pg/mL — ABNORMAL HIGH (ref 0.0–100.0)

## 2021-06-23 LAB — SODIUM, URINE, RANDOM: Sodium, Ur: <10 mmol/L

## 2021-06-23 LAB — CREATININE, URINE, RANDOM: Creatinine, Urine: 216.8 mg/dL

## 2021-06-23 LAB — TSH: TSH: 0.828 u[IU]/mL (ref 0.350–4.500)

## 2021-06-23 MED ORDER — LACTATED RINGERS IV SOLN: INTRAVENOUS | Status: AC

## 2021-06-23 NOTE — Evaluation (Signed)
Clinical/Bedside Swallow Evaluation Patient Details  Name: Amairani Shuey MRN: [id number] Date of Birth: 31-Oct-1915  Today's Date: 06/23/2021 Time: SLP Start Time (ACUTE ONLY): 1411 SLP Stop Time (ACUTE ONLY): 1422 SLP Time Calculation (min) (ACUTE ONLY): 11 min  Past Medical History:  Past Medical History:  Diagnosis Date   Anxiety    Anxiety    CHF (congestive heart failure) (HCC)    COVID-19    Dementia (HCC)    Depression    GERD (gastroesophageal reflux disease)    Hypertension    Vascular dementia, uncomplicated (HCC)    Past Surgical History: History reviewed. No pertinent surgical history. HPI:  Monteen Toops, 87 y.o. female with PMH of chronic systolic CHF, GERD, dementia, depression brought from nursing facility with 2 days of progressive somnolence diminished interaction declining oral intake relative to her baseline mental and functional status which she is reportedly alert and able to feed herself.  In the ED work-up showed stable vital signs, COVID-19 negative UA concerning for UTI CT head and CT head yesterday as below with no acute finding-    Assessment / Plan / Recommendation  Clinical Impression  Pt demonstrates poor awareness of PO and dry oral mucosa after several days not accepting PO. With cueing and moistening oral mucosa with oral care and a wet spoon pt gradually sensing water and eventually took straw sips of water consecutively without signs of aspiration. She also consumed purees well and said 'mmm." Would not restrict pt from access to purees and thin liquids. She may not be alert enough to take PO, but if she is, reposition pt and offer straw and purees. Will f/u for tolerance. SLP Visit Diagnosis: Dysphagia, unspecified (R13.10)    Aspiration Risk       Diet Recommendation Dysphagia 1 (Puree);Thin liquid   Liquid Administration via: Straw Medication Administration: Crushed with puree    Other  Recommendations      Recommendations for follow  up therapy are one component of a multi-disciplinary discharge planning process, led by the attending physician.  Recommendations may be updated based on patient status, additional functional criteria and insurance authorization.  Follow up Recommendations No SLP follow up      Assistance Recommended at Discharge    Functional Status Assessment Patient has had a recent decline in their functional status and/or demonstrates limited ability to make significant improvements in function in a reasonable and predictable amount of time  Frequency and Duration min 2x/week  1 week       Prognosis Prognosis for Safe Diet Advancement: Fair      Swallow Study   General HPI: Mathilda Maguire, 87 y.o. female with PMH of chronic systolic CHF, GERD, dementia, depression brought from nursing facility with 2 days of progressive somnolence diminished interaction declining oral intake relative to her baseline mental and functional status which she is reportedly alert and able to feed herself.  In the ED work-up showed stable vital signs, COVID-19 negative UA concerning for UTI CT head and CT head yesterday as below with no acute finding- Type of Study: Bedside Swallow Evaluation Previous Swallow Assessment: none Diet Prior to this Study: NPO Temperature Spikes Noted: No Respiratory Status: Room air History of Recent Intubation: No Behavior/Cognition: Confused Oral Cavity Assessment: Dry Oral Care Completed by SLP: Yes Oral Cavity - Dentition: Edentulous Self-Feeding Abilities: Total assist Patient Positioning: Upright in bed Baseline Vocal Quality: Normal Volitional Cough: Cognitively unable to elicit Volitional Swallow: Unable to elicit    Oral/Motor/Sensory Function Overall Oral  Motor/Sensory Function: Generalized oral weakness   Ice Chips Ice chips: Impaired Oral Phase Impairments: Poor awareness of bolus   Thin Liquid Thin Liquid: Within functional limits Presentation: Straw    Nectar Thick  Nectar Thick Liquid: Not tested   Honey Thick Honey Thick Liquid: Not tested   Puree Puree: Within functional limits   Solid     Solid: Not tested      Jujhar Everett, Riley Nearing 06/23/2021,2:57 PM

## 2021-06-23 NOTE — Plan of Care (Signed)
  Problem: Education: Goal: Knowledge of General Education information will improve Description: Including pain rating scale, medication(s)/side effects and non-pharmacologic comfort measures Outcome: Progressing   Problem: Health Behavior/Discharge Planning: Goal: Ability to manage health-related needs will improve Outcome: Progressing   Problem: Clinical Measurements: Goal: Ability to maintain clinical measurements within normal limits will improve Outcome: Progressing Goal: Will remain free from infection Outcome: Progressing Goal: Diagnostic test results will improve Outcome: Progressing Goal: Respiratory complications will improve Outcome: Progressing Goal: Cardiovascular complication will be avoided Outcome: Progressing   Problem: Education: Goal: Knowledge of General Education information will improve Description: Including pain rating scale, medication(s)/side effects and non-pharmacologic comfort measures Outcome: Progressing   Problem: Pain Managment: Goal: General experience of comfort will improve Outcome: Progressing   Problem: Safety: Goal: Ability to remain free from injury will improve Outcome: Progressing   Problem: Skin Integrity: Goal: Risk for impaired skin integrity will decrease Outcome: Progressing   Problem: Clinical Measurements: Goal: Ability to maintain clinical measurements within normal limits will improve Outcome: Progressing Goal: Will remain free from infection Outcome: Progressing Goal: Diagnostic test results will improve Outcome: Progressing Goal: Respiratory complications will improve Outcome: Progressing Goal: Cardiovascular complication will be avoided Outcome: Progressing   Problem: Activity: Goal: Risk for activity intolerance will decrease Outcome: Progressing

## 2021-06-23 NOTE — Evaluation (Signed)
Physical Therapy Evaluation Patient Details Name: Monique Petersen MRN: [id number] DOB: May 25, 1916 Today's Date: 06/23/2021  History of Present Illness  Pt is a 87 y/o female who was brought from nursing facility with 2 days of progressive somnolence, diminished interaction, declining oral intake relative to her baseline mental and functional status which she is reportedly alert and able to feed herself. In the ED work-up showed UA concerning for UTI. CT head revealed no acute finding. PMH significant for chronic systolic CHF, dementia, depression.   Clinical Impression  Pt admitted with above diagnosis. Pt currently with functional limitations due to the deficits listed below (see PT Problem List). At the time of PT eval pt was lethargic and unable to participate with much of the assessment. Pt showed minimal active movement, and was resisting UE/LE movement. Strength appeared to be at least 4-/5 in quads and biceps. Pt moaning and attempting to speak at times but grossly unable to make out what she was trying to say. Pt was repositioned in the bed with pillows between knees, under head, and under L UE. Anticipate return to SNF at d/c. Acutely, pt will benefit from skilled PT to increase their independence and safety with mobility to allow discharge to the venue listed below.          Recommendations for follow up therapy are one component of a multi-disciplinary discharge planning process, led by the attending physician.  Recommendations may be updated based on patient status, additional functional criteria and insurance authorization.  Follow Up Recommendations Skilled nursing-short term rehab (<3 hours/day)    Assistance Recommended at Discharge Frequent or constant Supervision/Assistance  Functional Status Assessment Patient has had a recent decline in their functional status and demonstrates the ability to make significant improvements in function in a reasonable and predictable amount of  time.  Equipment Recommendations  None recommended by PT    Recommendations for Other Services       Precautions / Restrictions Precautions Precautions: Fall Restrictions Weight Bearing Restrictions: No      Mobility  Bed Mobility               General bed mobility comments: Pt required assist for all aspects of repositioning in the bed. Pt was seeking a flexed, "fetal" position in the bed facing R. Pillows placed between knees, under head, and behind L UE which she was tucking behind her back.    Transfers                   General transfer comment: Unable to assess    Ambulation/Gait               General Gait Details: Unable to assess  Stairs            Wheelchair Mobility    Modified Rankin (Stroke Patients Only) Modified Rankin (Stroke Patients Only) Pre-Morbid Rankin Score: Moderately severe disability Modified Rankin: Severe disability     Balance Overall balance assessment:  (Unable to assess)                                           Pertinent Vitals/Pain Pain Assessment: Faces Faces Pain Scale: Hurts even more Pain Location: knees with passive extension Pain Descriptors / Indicators: Grimacing;Moaning Pain Intervention(s): Limited activity within patient's tolerance;Monitored during session;Repositioned    Home Living Family/patient expects to be discharged to:: Skilled nursing facility  Prior Function Prior Level of Function : Needs assist             Mobility Comments: Per chart review, pt was able to use a rollator but spent most of her time in the wheelchair. ADLs Comments: Per chart review, pt was able to feed herself PTA however required assist for all other ADL's.     Hand Dominance        Extremity/Trunk Assessment   Upper Extremity Assessment Upper Extremity Assessment: Generalized weakness (Unable to fully assess due to cognition and lethargy.)     Lower Extremity Assessment Lower Extremity Assessment: Generalized weakness (Unable to fully assess due to cognition and lethargy.)    Cervical / Trunk Assessment Cervical / Trunk Assessment:  (Unable to assess)  Communication   Communication: Expressive difficulties  Cognition Arousal/Alertness: Lethargic Behavior During Therapy: Flat affect Overall Cognitive Status: History of cognitive impairments - at baseline                                 General Comments: Pt unable to answer any orientation questions. Did attempt to give me her first name when asked, however only stated "Renae Fickle", instead of "Monique Petersen". Squeezed my hands to command and wiggled toes to command but otherwise did not follow any other commands Functional Status Assessment: Patient has had a recent decline in their functional status and/or demonstrates limited ability to make significant improvements in function in a reasonable and predictable amount of time      General Comments      Exercises     Assessment/Plan    PT Assessment Patient needs continued PT services  PT Problem List Decreased strength;Decreased range of motion;Decreased activity tolerance;Decreased balance;Decreased mobility;Decreased coordination;Decreased cognition;Decreased knowledge of use of DME;Decreased safety awareness;Decreased knowledge of precautions;Pain       PT Treatment Interventions DME instruction;Gait training;Functional mobility training;Therapeutic activities;Therapeutic exercise;Neuromuscular re-education;Balance training;Cognitive remediation;Patient/family education;Wheelchair mobility training    PT Goals (Current goals can be found in the Care Plan section)  Acute Rehab PT Goals Patient Stated Goal: None stated PT Goal Formulation: Patient unable to participate in goal setting Time For Goal Achievement: 07/07/21 Potential to Achieve Goals: Fair    Frequency Min 2X/week   Barriers to discharge         Co-evaluation               AM-PAC PT "6 Clicks" Mobility  Outcome Measure Help needed turning from your back to your side while in a flat bed without using bedrails?: Total Help needed moving from lying on your back to sitting on the side of a flat bed without using bedrails?: Total Help needed moving to and from a bed to a chair (including a wheelchair)?: Total Help needed standing up from a chair using your arms (e.g., wheelchair or bedside chair)?: Total Help needed to walk in hospital room?: Total Help needed climbing 3-5 steps with a railing? : Total 6 Click Score: 6    End of Session   Activity Tolerance: Patient limited by lethargy Patient left: in bed;with call bell/phone within reach;with bed alarm set Nurse Communication: Mobility status PT Visit Diagnosis: Muscle weakness (generalized) (M62.81);Other abnormalities of gait and mobility (R26.89)    Time: 4098-1191 PT Time Calculation (min) (ACUTE ONLY): 13 min   Charges:   PT Evaluation $PT Eval Moderate Complexity: 1 Mod          [name], PT, DPT Acute  Rehabilitation Services Pager: [phone] Office: [phone]   [name] 06/23/2021, 3:03 PM

## 2021-06-23 NOTE — Consult Note (Signed)
Palliative Medicine Inpatient Consult Note  Consulting Provider: Lanae Boast, MD  Reason for consult:   Palliative Care Consult Services Palliative Medicine Consult  Reason for Consult? GOC   HPI:  Per intake H&P --> Monique Petersen, 87 y.o. female with PMH of chronic systolic CHF, GERD, dementia, depression brought from nursing facility with 2 days of progressive somnolence diminished interaction declining oral intake relative to her baseline mental and functional status which she is reportedly alert and able to feed herself. Has been identified to have a UTI which is being treated.   Palliative care has been asked to get involved in the setting of advanced age and chronic co-morbidities to discuss goals of care.  Clinical Assessment/Goals of Care:  *Please note that this is a verbal dictation therefore any spelling or grammatical errors are due to the "Dragon Medical One" system interpretation.  I have reviewed medical records including EPIC notes, labs and imaging, received report from bedside RN, assessed the patient who is lying in bed alert to self only.    I called Monique Petersen (daughter)  to further discuss diagnosis prognosis, GOC, EOL wishes, disposition and options.  We reviewed in brief Monique Petersen's chronic disease processes. We discussed her h/o systolic heart failure, dementia thought to be alzheimer's type, and depression.   We reviewed that Monique Petersen has had multiple falls in her home in the setting of heart failure. We discussed how trial-some it was for Monique Petersen to care for her over the last few years and ultimately that Monique Petersen has been institutionalized at [facility] for the past year.   I introduced Palliative Medicine as specialized medical care for people living with serious illness. It focuses on providing relief from the symptoms and stress of a serious illness. The goal is to improve quality of life for both the patient and the family.  Monique Petersen is from  [place], [place] originally. She has been married twice. She had five children one of whom is deceased. 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 She was heavily involved in her church and is of the [place] faith.   Monique Petersen has lived at [facility] for over a year now. She is able to feed herself but needs help with mostly everything else. Her daughter shares that she would like her to be as functional as possible thereafter. She states that prior to hospitalization she used a rollator and much of the time was in a wheelchair.   A detailed discussion was had today regarding advanced directives, patient has none on file though Monique Petersen shares her brother, Monique Petersen is in charge of everything financial and she is in charge of everything healthcare related.    Concepts specific to code status, artifical feeding and hydration, continued IV antibiotics and rehospitalization was had.  In the past, Monique Petersen had been clear about being a DNR. Monique Petersen goes on to share with me that with her mothers fragile state she would be unlikely to survive a resuscitation and she would not want her to suffer through such an event.   We reviewed that at the present time the goal is to treat Monique Petersen's urine infection. Monique Petersen is open to an OP Palliative care provider following along. She shares that she would not be interested in hospice at this time and wants to see how well her mother can improve.   Discussed the importance of continued conversation with family and their  medical providers regarding overall plan of care and treatment options, ensuring  decisions are within the context of the patients values and GOCs.  Decision Maker: Monique Petersen (daughter) [phone]  SUMMARY OF RECOMMENDATIONS   DNAR/DNI  Treat what is treatable  Goal: Treat UTI and patient to return to [facility]  TOC - OP Palliative support discussed with daughter  Ongoing incremental Palliative support until  discharge  Code Status/Advance Care Planning: DNAR/DNI    Palliative Prophylaxis:  Oral Care, Delirium Precautions  Additional Recommendations (Limitations, Scope, Preferences): Continue to treat the treatable   Psycho-social/Spiritual:  Desire for further Chaplaincy support: Yes, very faithful Additional Recommendations: Education on chronic disease processes   Prognosis: Advanced age and frailty. Multiple co-morbidities placed at increased 12 month mortality risk.   Discharge Planning: Discharge to [facility] when medically optimized.  Vitals:   06/23/21 0435 06/23/21 0755  BP: 108/67 (!) 100/50  Pulse: 73 68  Resp: 20 16  Temp: 97.8 F (36.6 C) 98.1 F (36.7 C)  SpO2: 99% 100%    Intake/Output Summary (Last 24 hours) at 06/23/2021 1108 Last data filed at 06/23/2021 0300 Gross per 24 hour  Intake 1138.58 ml  Output 50 ml  Net 1088.58 ml   Last Weight  Most recent update: 06/23/2021  5:20 AM    Weight  67.3 kg (148 lb 5.9 oz)            Gen:  Elderly AA F in NAD HEENT: Dry mucous membranes CV: Regular rate and rhythm  PULM:  On RA ABD: soft/nontender  EXT: (+) BLE trace edema  Neuro: Alert to self  PPS: 20-30%   This conversation/these recommendations were discussed with patient primary care team, Dr. Jonathon Petersen  Time In: 1020 Time Out: 1130 Total Time: 70 Greater than 50%  of this time was spent counseling and coordinating care related to the above assessment and plan.  Monique Petersen [facility] Palliative Medicine Team Team Cell Phone: [phone] Please utilize secure chat with additional questions, if there is no response within 30 minutes please call the above phone number  Palliative Medicine Team providers are available by phone from 7am to 7pm daily and can be reached through the team cell phone.  Should this patient require assistance outside of these hours, please call the patient's attending physician.

## 2021-06-23 NOTE — Plan of Care (Signed)

## 2021-06-23 NOTE — Progress Notes (Signed)
PROGRESS NOTE    Monique Petersen  MWU:[id number] DOB: 05-Oct-1915 DOA: 06/22/2021 PCP: Charlynne Pander, MD   Chief Complaint  Patient presents with   Failure To Thrive  Brief Narrative/Hospital Course:  Monique Petersen, 87 y.o. female with PMH of chronic systolic CHF, GERD, dementia, depression brought from nursing facility with 2 days of progressive somnolence diminished interaction declining oral intake relative to her baseline mental and functional status which she is reportedly alert and able to feed herself. In the ED work-up showed stable vital signs, COVID-19 negative UA concerning for UTI CT head and CT head yesterday as below with no acute finding- CT head- Motion degraded study.  No acute abnormality Atrophy and chronic ischemia.  Chronic infarct right basal ganglia CT renal study : No renal or ureteral stones.  No hydronephrosis. Liver lesions are unchanged and difficult to characterize on this noncontrast study. Aortoiliac atherosclerosis. Fibroid uterus EKG prolonged QTC 518, patient was given ceftriaxone fluid and admitted   Subjective: Seen this morning.  Patient is confused mumbling words does not follow any instruction.  Withdraws to pain all extremities nonfocal  grossly. Overnight no fever blood pressure soft 90s to 100 Creatinine downtrending, BNP 1681, total bili 1.6 creatinine 1.2 WBC count stable  Assessment & Plan:  Acute lower UTI: Hemodynamically stable continue ceftriaxone pending urine culture.  Acute metabolic encephalopathy due to #1 Background history of dementia: Treat underlying UTI continue supportive care delirium precaution fall precaution.  Minimize psychotropic medications hopefully can resume her Seroquel and Ativan soon once abel to take po - N.p.o. now- [facility] consulted  Current  HFrEF: Chest x-ray no acute finding BNP 1681.  On high-dose Lasix Aldactone and potassium normally  AKI with mild metabolic acidosis bicarb 19.  Creatinine downtrending.  Hold off ivf due to CHF, if not able to take p.o. consider gentle ivf Recent Labs  Lab 06/22/21 1945 06/23/21 0207  BUN 29* 29*  CREATININE 1.42* 1.29*     Mildly elevated total bili: Monitor  GAD/depression: On Seroquel, Zoloft and Ativan held on admission resume slowly- appears encephalopathic this morning  Goals of care currently full code palliative care consulted given her advanced age risk of decompensation and overall declining health.  Overall prognosis does not appear bright  DVT prophylaxis: SCDs Start: 06/22/21 2308 Code Status:   Code Status: Full Code Family Communication: plan of care discussed with patient/ RN at bedside. Status is: Inpatient  Remains inpatient appropriate because: For ongoing management of UTI and encephalopathy Disposition: Currently not  medically stable for discharge. Anticipated Disposition: TBD  Objective: Vitals last 24 hrs: Vitals:   06/23/21 0105 06/23/21 0435 06/23/21 0500 06/23/21 0755  BP: 115/69 108/67  (!) 100/50  Pulse: 76 73  68  Resp: 20 20  16   Temp: 98.2 F (36.8 C) 97.8 F (36.6 C)  98.1 F (36.7 C)  TempSrc: Oral Oral  Oral  SpO2: 100% 99%  100%  Weight:   67.3 kg    Weight change:   Intake/Output Summary (Last 24 hours) at 06/23/2021 1027 Last data filed at 06/23/2021 0300 Gross per 24 hour  Intake 1138.58 ml  Output 50 ml  Net 1088.58 ml   Net IO Since Admission: 1,088.58 mL [06/23/21 1027]   Physical Examination: General exam: Eyes open does not follow, mumbling words.  Elderly debilitated.   HEENT:Oral mucosa moist, Ear/Nose WNL grossly,dentition normal. Respiratory system: B/l diminished BS, no use of accessory muscle, non tender. Cardiovascular system: S1 & S2 +,No JVD. Gastrointestinal system:  Abdomen soft, NT,ND, BS+. Nervous System:Withdraws to pain all on all extremities nonfocal  grossly. Extremities: edema none, distal peripheral pulses palpable.  Skin: No rashes, no icterus. MSK: Normal muscle  bulk, tone, power.  Medications reviewed:Scheduled Meds: Continuous Infusions:  cefTRIAXone (ROCEPHIN)  IV     Diet Order             Diet NPO time specified  Diet effective now                  Weight change:   Wt Readings from Last 3 Encounters:  06/23/21 67.3 kg     Consultants:see note  Procedures:see note Antimicrobials: Anti-infectives (From admission, onward)    Start     Dose/Rate Route Frequency Ordered Stop   06/23/21 2000  cefTRIAXone (ROCEPHIN) 1 g in sodium chloride 0.9 % 100 mL IVPB        1 g 200 mL/hr over 30 Minutes Intravenous Every 24 hours 06/22/21 2311     06/22/21 2245  cefTRIAXone (ROCEPHIN) 1 g in sodium chloride 0.9 % 100 mL IVPB        1 g 200 mL/hr over 30 Minutes Intravenous  Once 06/22/21 2237 06/22/21 2356      Culture/Microbiology No results found for: SDES, SPECREQUEST, CULT, REPTSTATUS  Other culture-see note  Unresulted Labs (From admission, onward)     Start     Ordered   06/22/21 2339  Creatinine, urine, random  Add-on,   AD        06/22/21 2338   06/22/21 2339  Sodium, urine, random  Add-on,   AD        06/22/21 2338   06/22/21 2338  Methylmalonic acid, serum  Add-on,   AD        06/22/21 2337   06/22/21 2338  Rapid urine drug screen (hospital performed)  Add-on,   AD        06/22/21 2337   06/22/21 2330  Urine Culture  Add-on,   AD       Question:  Indication  Answer:  Dysuria   06/22/21 2329   06/22/21 1839  Blood culture (routine x 2)  BLOOD CULTURE X 2,   STAT      06/22/21 1838           Data Reviewed: I have personally reviewed following labs and imaging studies CBC: Recent Labs  Lab 06/22/21 1945 06/22/21 1959 06/23/21 0207  WBC 7.3  --  6.3  NEUTROABS 4.7  --  4.2  HGB 15.6* 16.3* 14.2  HCT 47.3* 48.0* 43.5  MCV 93.1  --  94.0  PLT 214  --  167   Basic Metabolic Panel: Recent Labs  Lab 06/22/21 1945 06/22/21 1959 06/23/21 0207  NA 142 144 144  K 4.1 4.2 4.0  CL 106  --  112*  CO2 22  --   19*  GLUCOSE 135*  --  109*  BUN 29*  --  29*  CREATININE 1.42*  --  1.29*  CALCIUM 9.8  --  9.2  MG  --   --  2.4   GFR: CrCl cannot be calculated (Unknown ideal weight.). Liver Function Tests: Recent Labs  Lab 06/22/21 1945 06/23/21 0207  AST 20 16  ALT 24 19  ALKPHOS 80 67  BILITOT 2.1* 1.6*  PROT 7.9 6.6  ALBUMIN 3.8 3.1*   No results for input(s): LIPASE, AMYLASE in the last 168 hours. Recent Labs  Lab 06/22/21 1945  AMMONIA 15  Coagulation Profile: No results for input(s): INR, PROTIME in the last 168 hours. Cardiac Enzymes: Recent Labs  Lab 06/23/21 0207  CKTOTAL 92   BNP (last 3 results) No results for input(s): PROBNP in the last 8760 hours. HbA1C: No results for input(s): HGBA1C in the last 72 hours. CBG: Recent Labs  Lab 06/22/21 1923  GLUCAP 151*   Lipid Profile: No results for input(s): CHOL, HDL, LDLCALC, TRIG, CHOLHDL, LDLDIRECT in the last 72 hours. Thyroid Function Tests: Recent Labs    06/23/21 0207  TSH 0.828   Anemia Panel: No results for input(s): VITAMINB12, FOLATE, FERRITIN, TIBC, IRON, RETICCTPCT in the last 72 hours. Sepsis Labs: Recent Labs  Lab 06/22/21 1945 06/23/21 0207  PROCALCITON  --  <0.10  LATICACIDVEN 1.9  --     Recent Results (from the past 240 hour(s))  Resp Panel by RT-PCR (Flu A&B, Covid) Nasopharyngeal Swab     Status: None   Collection Time: 06/22/21  6:39 PM   Specimen: Nasopharyngeal Swab; Nasopharyngeal(NP) swabs in vial transport medium  Result Value Ref Range Status   SARS Coronavirus 2 by RT PCR NEGATIVE NEGATIVE Final    Comment: (NOTE) SARS-CoV-2 target nucleic acids are NOT DETECTED.  The SARS-CoV-2 RNA is generally detectable in upper respiratory specimens during the acute phase of infection. The lowest concentration of SARS-CoV-2 viral copies this assay can detect is 138 copies/mL. A negative result does not preclude SARS-Cov-2 infection and should not be used as the sole basis for  treatment or other patient management decisions. A negative result may occur with  improper specimen collection/handling, submission of specimen other than nasopharyngeal swab, presence of viral mutation(s) within the areas targeted by this assay, and inadequate number of viral copies(<138 copies/mL). A negative result must be combined with clinical observations, patient history, and epidemiological information. The expected result is Negative.  Fact Sheet for Patients:  BloggerCourse.com  Fact Sheet for Healthcare Providers:  SeriousBroker.it  This test is no t yet approved or cleared by the [place] FDA and  has been authorized for detection and/or diagnosis of SARS-CoV-2 by FDA under an Emergency Use Authorization (EUA). This EUA will remain  in effect (meaning this test can be used) for the duration of the COVID-19 declaration under Section 564(b)(1) of the Act, 21 U.S.C.section 360bbb-3(b)(1), unless the authorization is terminated  or revoked sooner.       Influenza A by PCR NEGATIVE NEGATIVE Final   Influenza B by PCR NEGATIVE NEGATIVE Final    Comment: (NOTE) The Xpert Xpress SARS-CoV-2/FLU/RSV plus assay is intended as an aid in the diagnosis of influenza from Nasopharyngeal swab specimens and should not be used as a sole basis for treatment. Nasal washings and aspirates are unacceptable for Xpert Xpress SARS-CoV-2/FLU/RSV testing.  Fact Sheet for Patients: BloggerCourse.com  Fact Sheet for Healthcare Providers: SeriousBroker.it  This test is not yet approved or cleared by the [place] FDA and has been authorized for detection and/or diagnosis of SARS-CoV-2 by FDA under an Emergency Use Authorization (EUA). This EUA will remain in effect (meaning this test can be used) for the duration of the COVID-19 declaration under Section 564(b)(1) of the Act, 21  U.S.C. section 360bbb-3(b)(1), unless the authorization is terminated or revoked.  Performed at [facility], [address] [place], [place] [address]      Radiology Studies: DG Chest 1 View  Result Date: 06/22/2021 CLINICAL DATA:  Not eating for 2 days. EXAM: CHEST  1 VIEW COMPARISON:  Chest x-ray 09/02/2019. FINDINGS: Examination is limited secondary to patient rotation. The cardiomediastinal silhouette is grossly unchanged. The heart is enlarged the aorta is tortuous. There is no lung infiltrate, pleural effusion or pneumothorax. No acute fractures are seen. IMPRESSION: 1. Limited examination.  No definite acute cardiopulmonary process. Electronically Signed   By: Darliss Cheney M.D.   On: 06/22/2021 19:18   CT HEAD WO CONTRAST  Result Date: 06/22/2021 CLINICAL DATA:  Delirium EXAM: CT HEAD WITHOUT CONTRAST TECHNIQUE: Contiguous axial images were obtained from the base of the skull through the vertex without intravenous contrast. COMPARISON:  None. FINDINGS: Brain: Image quality degraded by significant motion. Moderate atrophy. Chronic microvascular ischemic change in the white matter and right basal ganglia. Negative for acute infarct, hemorrhage, mass Vascular: Negative for hyperdense vessel Skull: Negative Sinuses/Orbits: Negative Other: None IMPRESSION: Motion degraded study.  No acute abnormality Atrophy and chronic ischemia.  Chronic infarct right basal ganglia. Electronically Signed   By: Marlan Palau M.D.   On: 06/22/2021 19:24   CT Renal Stone Study  Result Date: 06/22/2021 CLINICAL DATA:  Flank pain, kidney stone suspected EXAM: CT ABDOMEN AND PELVIS WITHOUT CONTRAST TECHNIQUE: Multidetector CT imaging of the abdomen and pelvis was performed following the standard protocol without IV contrast. COMPARISON:  02/02/2020 FINDINGS: Lower chest: No acute abnormality Hepatobiliary: Calcification noted along the medial margin of the right hepatic lobe is stable since prior study,  likely calcified lesion. Low-density lesion in the right hepatic lobe measures 4 cm, stable. Gallbladder unremarkable. Pancreas: No focal abnormality or ductal dilatation. Spleen: No focal abnormality.  Normal size. Adrenals/Urinary Tract: No renal or ureteral stones. No hydronephrosis. Adrenal glands unremarkable. Urinary bladder not well visualized due to beam hardening artifact in the pelvis. Stomach/Bowel: Stomach, large and small bowel grossly unremarkable. Vascular/Lymphatic: Calcified aorta and iliac vessels. No evidence of aneurysm or adenopathy. IVC filter noted within the infrarenal IVC. Reproductive: Numerous calcified fibroids in the uterus. No adnexal mass. Other: No free fluid or free air. Musculoskeletal: Prior right hip replacement. No acute bony abnormality. IMPRESSION: No renal or ureteral stones.  No hydronephrosis. Liver lesions are unchanged and difficult to characterize on this noncontrast study. Aortoiliac atherosclerosis. Fibroid uterus. No acute findings. Electronically Signed   By: Charlett Nose M.D.   On: 06/22/2021 23:42     LOS: 1 day   Lanae Boast, MD [facility]  06/23/2021, 10:27 AM

## 2021-06-23 NOTE — Progress Notes (Signed)
HOSPITAL MEDICINE OVERNIGHT EVENT NOTE    Notified by nursing the patient's fluids have expired.  Patient was previously receiving lactated Ringer solution at 50 cc/h.  Provided/with nursing patient is still quite lethargic with inability to consistently tolerate oral intake.  Systolic blood pressures have occasionally dipped into the 80s.  Considering this information, will extend this gentle hydration at 50 cc an hour throughout the evening shift.  Marinda Elk  MD [facility]

## 2021-06-24 LAB — CBC
HCT: 41.9 % (ref 36.0–46.0)
Hemoglobin: 13.2 g/dL (ref 12.0–15.0)
MCH: 29.9 pg (ref 26.0–34.0)
MCHC: 31.5 g/dL (ref 30.0–36.0)
MCV: 95 fL (ref 80.0–100.0)
Platelets: 171 10*3/uL (ref 150–400)
RBC: 4.41 MIL/uL (ref 3.87–5.11)
RDW: 13.1 % (ref 11.5–15.5)
WBC: 7 10*3/uL (ref 4.0–10.5)
nRBC: 0 % (ref 0.0–0.2)

## 2021-06-24 LAB — COMPREHENSIVE METABOLIC PANEL
ALT: 16 U/L (ref 0–44)
AST: 18 U/L (ref 15–41)
Albumin: 3.1 g/dL — ABNORMAL LOW (ref 3.5–5.0)
Alkaline Phosphatase: 65 U/L (ref 38–126)
Anion gap: 10 (ref 5–15)
BUN: 25 mg/dL — ABNORMAL HIGH (ref 8–23)
CO2: 21 mmol/L — ABNORMAL LOW (ref 22–32)
Calcium: 9.3 mg/dL (ref 8.9–10.3)
Chloride: 114 mmol/L — ABNORMAL HIGH (ref 98–111)
Creatinine, Ser: 1.22 mg/dL — ABNORMAL HIGH (ref 0.44–1.00)
GFR, Estimated: 39 mL/min — ABNORMAL LOW (ref >60)
Glucose, Bld: 97 mg/dL (ref 70–99)
Potassium: 3.8 mmol/L (ref 3.5–5.1)
Sodium: 145 mmol/L (ref 135–145)
Total Bilirubin: 1.9 mg/dL — ABNORMAL HIGH (ref 0.3–1.2)
Total Protein: 6.5 g/dL (ref 6.5–8.1)

## 2021-06-24 MED ORDER — ENOXAPARIN SODIUM 30 MG/0.3ML IJ SOSY
30.0000 mg | PREFILLED_SYRINGE | Freq: Every day | INTRAMUSCULAR | Status: DC
  Administered 2021-06-24 – 2021-06-26 (×3): 30 mg via SUBCUTANEOUS
  Filled 2021-06-24 (×3): qty 0.3

## 2021-06-24 MED ORDER — ORAL CARE MOUTH RINSE
15.0000 mL | Freq: Two times a day (BID) | OROMUCOSAL | Status: DC
  Administered 2021-06-24 – 2021-06-25 (×2): 15 mL via OROMUCOSAL

## 2021-06-24 MED ORDER — LACTATED RINGERS IV SOLN: INTRAVENOUS | Status: DC

## 2021-06-24 MED ORDER — CHLORHEXIDINE GLUCONATE 0.12 % MT SOLN
15.0000 mL | Freq: Two times a day (BID) | OROMUCOSAL | Status: DC
  Administered 2021-06-24 – 2021-06-25 (×3): 15 mL via OROMUCOSAL
  Filled 2021-06-24 (×3): qty 15

## 2021-06-24 NOTE — Evaluation (Signed)
Occupational Therapy Evaluation Patient Details Name: Monique Petersen MRN: [id number] DOB: [birth year] Today's Date: 06/24/2021   History of Present Illness Pt is a 87 y/o female who was brought from nursing facility with 2 days of progressive somnolence, diminished interaction, declining oral intake relative to her baseline mental and functional status which she is reportedly alert and able to feed herself. In the ED work-up showed UA concerning for UTI. CT head revealed no acute finding. PMH significant for chronic systolic CHF, dementia, depression.   Clinical Impression   Pt admitted for concerns listed above. PTA chart reported that pt was able to complete pivot transfers to [facility], [facility], Recliner, and she was able to feed herself. At this time, pt is very lethargic, unable to follow any commands, keeps her eyes closed for entire session, and does not verbally respond. Pt requiring total assist with all ADL's and functional mobility. Recommending SNF at this time to maximize her return to prior level of functioning and reduce caregiver burden. OT will follow acutely.      Recommendations for follow up therapy are one component of a multi-disciplinary discharge planning process, led by the attending physician.  Recommendations may be updated based on patient status, additional functional criteria and insurance authorization.   Follow Up Recommendations  Skilled nursing-short term rehab (<3 hours/day)    Assistance Recommended at Discharge Frequent or constant Supervision/Assistance  Functional Status Assessment  Patient has had a recent decline in their functional status and/or demonstrates limited ability to make significant improvements in function in a reasonable and predictable amount of time  Equipment Recommendations  None recommended by OT    Recommendations for Other Services       Precautions / Restrictions Precautions Precautions: Fall Restrictions Weight Bearing Restrictions:  No      Mobility Bed Mobility               General bed mobility comments: Pt required assist for all aspects of repositioning in the bed. Pt was seeking a flexed, "fetal" position in the bed facing L. Pt positioned on her back in modified chair position with pillow behind her head, under her LUE and L side to assist with keeping her from leaning to the L side.    Transfers                   General transfer comment: Unable to assess      Balance                                           ADL either performed or assessed with clinical judgement   ADL Overall ADL's : Needs assistance/impaired                                       General ADL Comments: PT needing total Assist at this time for all ADL's and functionalmobility/ transfer     Vision   Additional Comments: Unable to assess as pt kept her eyes shut the entire session     Perception     Praxis      Pertinent Vitals/Pain Pain Assessment: Faces Faces Pain Scale: Hurts a little bit Breathing: normal Negative Vocalization: occasional moan/groan, low speech, negative/disapproving quality Facial Expression: facial grimacing Body Language: relaxed Consolability: no need to console PAINAD  Score: 0 Pain Location: LUE with PROM Pain Descriptors / Indicators: Grimacing;Moaning Pain Intervention(s): Limited activity within patient's tolerance;Monitored during session;Repositioned     Hand Dominance     Extremity/Trunk Assessment Upper Extremity Assessment Upper Extremity Assessment: Difficult to assess due to impaired cognition   Lower Extremity Assessment Lower Extremity Assessment: Defer to PT evaluation   Cervical / Trunk Assessment Cervical / Trunk Assessment:  (Unable to assess)   Communication Communication Communication: Expressive difficulties   Cognition Arousal/Alertness: Lethargic Behavior During Therapy: Flat affect Overall Cognitive Status:  History of cognitive impairments - at baseline                                 General Comments: Pt kept her eyes closed then entire session, did not verbally speak other than to say "oww" x2 when PROM her LUE. Did not follow any commands at this time, except potentially attempting to squeeze her R hand.     General Comments  Pt not following commands or opening her eyes.    Exercises Exercises: Other exercises Other Exercises Other Exercises: PROM of BUE x5, shoulder flexion, elbow flexion and extension, Wrist flexion and extention, and digit composite flexion and extension.   Shoulder Instructions      Home Living Family/patient expects to be discharged to:: Skilled nursing facility                                        Prior Functioning/Environment Prior Level of Function : Needs assist             Mobility Comments: Per chart review, pt was able to use a rollator but spent most of her time in the wheelchair. ADLs Comments: Per chart review, pt was able to feed herself PTA however required assist for all other ADL's.        OT Problem List: Decreased strength;Decreased range of motion;Decreased activity tolerance;Impaired balance (sitting and/or standing);Decreased coordination;Decreased cognition;Decreased knowledge of use of DME or AE;Decreased safety awareness;Impaired sensation;Impaired UE functional use;Pain      OT Treatment/Interventions: Self-care/ADL training;Therapeutic exercise;Neuromuscular education;Energy conservation;DME and/or AE instruction;Therapeutic activities;Cognitive remediation/compensation;Patient/family education;Balance training;Visual/perceptual remediation/compensation    OT Goals(Current goals can be found in the care plan section) Acute Rehab OT Goals Patient Stated Goal: none stated OT Goal Formulation: Patient unable to participate in goal setting Time For Goal Achievement: 07/08/21 Potential to Achieve  Goals: Good ADL Goals Pt Will Perform Grooming: with mod assist;sitting Additional ADL Goal #1: Pt will complete bed mobility with mod A Additional ADL Goal #2: Pt will participate in 1 ADL task for 3+ mins Additional ADL Goal #3: Pt will follow 50% of simple commands.  OT Frequency: Min 2X/week   Barriers to D/C:            Co-evaluation              AM-PAC OT "6 Clicks" Daily Activity     Outcome Measure Help from another person eating meals?: Total Help from another person taking care of personal grooming?: Total Help from another person toileting, which includes using toliet, bedpan, or urinal?: Total Help from another person bathing (including washing, rinsing, drying)?: Total Help from another person to put on and taking off regular upper body clothing?: Total Help from another person to put on and taking off regular lower body clothing?:  Total 6 Click Score: 6   End of Session Nurse Communication: Mobility status  Activity Tolerance: Patient limited by lethargy Patient left: in bed;with call bell/phone within reach;with bed alarm set;with nursing/sitter in room  OT Visit Diagnosis: Unsteadiness on feet (R26.81);Other abnormalities of gait and mobility (R26.89);Muscle weakness (generalized) (M62.81)                Time: 4315-4008 OT Time Calculation (min): 15 min Charges:  OT General Charges $OT Visit: 1 Visit OT Evaluation $OT Eval Moderate Complexity: 1 Mod  Aikam Vinje H., OTR/L Acute Rehabilitation  Lezette Kitts Elane Andrewjames Weirauch 06/24/2021, 11:58 AM

## 2021-06-24 NOTE — Care Management Important Message (Signed)
Important Message  Patient Details  Name: Monique Petersen MRN: [id number] Date of Birth: [birth year]   Medicare Important Message Given:  Yes     Powell Halbert Stefan Church 06/24/2021, 2:14 PM

## 2021-06-24 NOTE — Progress Notes (Signed)
PROGRESS NOTE    Monique Petersen  IFO:[id number] DOB: 06-05-1916 DOA: 06/22/2021 PCP: Charlynne Pander, MD   Chief Complaint  Patient presents with   Failure To Thrive  Brief Narrative/Hospital Course:  Monique Petersen, 87 y.o. female with PMH of chronic systolic CHF, GERD, dementia, depression brought from nursing facility with 2 days of progressive somnolence diminished interaction declining oral intake relative to her baseline mental and functional status which she is reportedly alert and able to feed herself. In the ED work-up showed stable vital signs, COVID-19 negative UA concerning for UTI CT head and CT head yesterday as below with no acute finding- CT head- Motion degraded study.  No acute abnormality Atrophy and chronic ischemia.  Chronic infarct right basal ganglia CT renal study : No renal or ureteral stones.  No hydronephrosis. Liver lesions are unchanged and difficult to characterize on this noncontrast study. Aortoiliac atherosclerosis. Fibroid uterus EKG prolonged QTC 518, patient was given ceftriaxone fluid and admitted   Subjective: Seen this morning.  Patient mumbling words does not follow commands appears encephalopathic Overnight afebrile lethargic still not taking p.o. well. Labs with creatinine stable 1.2  Assessment & Plan:  Acute lower UTI: Continue empiric ceftriaxone.  Blood culture pending, added urine culture as it was not sent originally.    Acute metabolic encephalopathy due to #1 Background history of dementia: Patient remains lethargic.  Continue antibiotics.Continue supportive care delirium precaution fall precaution.  Minimize psychotropic medications.  Speech eval placed on dysphagia 1 diet.  She is on Seroquel and Ativan-hopefully we can resume once less lethargic.  TSH CK procalcitonin normal.  MMA pending  Current  HFrEF: Appears compensated.  Chest x-ray no acute finding BNP 1681.  On high-dose Lasix Aldactone and potassium normally-currently on  hold  AKI with mild metabolic acidosis bicarb 19.  Continue on gentle IV fluid hydration-while oral intake is inconsistent . history of CHF, monitor intake output. Net IO Since Admission: 945.54 mL [06/24/21 0941]  Recent Labs  Lab 06/22/21 1945 06/23/21 0207 06/24/21 0149  BUN 29* 29* 25*  CREATININE 1.42* 1.29* 1.22*    Mildly elevated total bili: Monitor Prolonged MVE:HMCNO QT prolonging medication, monitor GAD/depression: On Seroquel, Zoloft and Ativan held on admission- still lethargics- hold off on resuming it  Goals of care DNR, appreciate palliative care INPUTS.Outpatient palliative care follow-up and planning to return back to skilled nursing facility. Overall prognosis does not appear bright  DVT prophylaxis: enoxaparin (LOVENOX) injection 30 mg Start: 06/24/21 1000 SCDs Start: 06/22/21 2308 Code Status:   Code Status: DNR Family Communication: plan of care discussed with patient/ RN at bedside. Status is: Inpatient  Remains inpatient appropriate because: For ongoing management of UTI and encephalopathy Disposition: Currently not  medically stable for discharge. Anticipated Disposition: Plan to discharge back to facility once mentation improves taking p.o.  Objective: Vitals last 24 hrs: Vitals:   06/23/21 1956 06/23/21 2351 06/24/21 0354 06/24/21 0744  BP: 95/61 107/63 109/68 106/68  Pulse: 76 87 84 88  Resp: 18 (!) 21 (!) 23 18  Temp: 98 F (36.7 C) 98.3 F (36.8 C) 98.3 F (36.8 C) 98 F (36.7 C)  TempSrc: Axillary Axillary Oral   SpO2: 97% 96% 97% 96%  Weight:       Weight change:   Intake/Output Summary (Last 24 hours) at 06/24/2021 0941 Last data filed at 06/24/2021 0318 Gross per 24 hour  Intake 306.96 ml  Output 450 ml  Net -143.04 ml   Net IO Since Admission: 945.54 mL [  06/24/21 0941]   Physical Examination: General exam: Lethargic mumbling words unable to follow any commands.  Elderly.   HEENT:Oral mucosa dry with crustation, Ear/Nose WNL  grossly, dentition normal. Respiratory system: bilaterally clear, no use of accessory muscle Cardiovascular system: S1 & S2 +, No JVD,. Gastrointestinal system: Abdomen soft,NT,ND, BS+ Nervous System: Lethargic, weak.   Extremities: no edema, distal peripheral pulses palpable.  Skin: No rashes,no icterus. MSK: small muscle bulk,tone, power   Medications reviewed:Scheduled Meds:  enoxaparin (LOVENOX) injection  30 mg Subcutaneous Daily   Continuous Infusions:  cefTRIAXone (ROCEPHIN)  IV Stopped (06/23/21 2040)   lactated ringers     Diet Order             DIET - DYS 1 Room service appropriate? Yes; Fluid consistency: Thin  Diet effective now                  Weight change:   Wt Readings from Last 3 Encounters:  06/23/21 67.3 kg     Consultants: PMT Procedures:see note Antimicrobials: Anti-infectives (From admission, onward)    Start     Dose/Rate Route Frequency Ordered Stop   06/23/21 2000  cefTRIAXone (ROCEPHIN) 1 g in sodium chloride 0.9 % 100 mL IVPB        1 g 200 mL/hr over 30 Minutes Intravenous Every 24 hours 06/22/21 2311     06/22/21 2245  cefTRIAXone (ROCEPHIN) 1 g in sodium chloride 0.9 % 100 mL IVPB        1 g 200 mL/hr over 30 Minutes Intravenous  Once 06/22/21 2237 06/22/21 2356      Culture/Microbiology    Component Value Date/Time   SDES BLOOD RIGHT HAND 06/22/2021 1946   SPECREQUEST  06/22/2021 1946    BOTTLES DRAWN AEROBIC AND ANAEROBIC Blood Culture results may not be optimal due to an inadequate volume of blood received in culture bottles   CULT  06/22/2021 1946    NO GROWTH < 24 HOURS Performed at [facility], [address], [place], [place] [address]    REPTSTATUS PENDING 06/22/2021 1946    Other culture-see note  Unresulted Labs (From admission, onward)     Start     Ordered   06/24/21 0500  CBC  Daily,   R      06/23/21 1030   06/24/21 0500  Comprehensive metabolic panel  Daily,   R      06/23/21 1030   06/22/21 2338   Methylmalonic acid, serum  Add-on,   AD        06/22/21 2337   06/22/21 2330  Urine Culture  Add-on,   AD       Question:  Indication  Answer:  Dysuria   06/22/21 2329           Data Reviewed: I have personally reviewed following labs and imaging studies CBC: Recent Labs  Lab 06/22/21 1945 06/22/21 1959 06/23/21 0207 06/24/21 0149  WBC 7.3  --  6.3 7.0  NEUTROABS 4.7  --  4.2  --   HGB 15.6* 16.3* 14.2 13.2  HCT 47.3* 48.0* 43.5 41.9  MCV 93.1  --  94.0 95.0  PLT 214  --  167 171   Basic Metabolic Panel: Recent Labs  Lab 06/22/21 1945 06/22/21 1959 06/23/21 0207 06/24/21 0149  NA 142 144 144 145  K 4.1 4.2 4.0 3.8  CL 106  --  112* 114*  CO2 22  --  19* 21*  GLUCOSE 135*  --  109* 97  BUN 29*  --  29* 25*  CREATININE 1.42*  --  1.29* 1.22*  CALCIUM 9.8  --  9.2 9.3  MG  --   --  2.4  --    GFR: CrCl cannot be calculated (Unknown ideal weight.). Liver Function Tests: Recent Labs  Lab 06/22/21 1945 06/23/21 0207 06/24/21 0149  AST 20 16 18   ALT 24 19 16   ALKPHOS 80 67 65  BILITOT 2.1* 1.6* 1.9*  PROT 7.9 6.6 6.5  ALBUMIN 3.8 3.1* 3.1*   No results for input(s): LIPASE, AMYLASE in the last 168 hours. Recent Labs  Lab 06/22/21 1945  AMMONIA 15   Coagulation Profile: No results for input(s): INR, PROTIME in the last 168 hours. Cardiac Enzymes: Recent Labs  Lab 06/23/21 0207  CKTOTAL 92   BNP (last 3 results) No results for input(s): PROBNP in the last 8760 hours. HbA1C: No results for input(s): HGBA1C in the last 72 hours. CBG: Recent Labs  Lab 06/22/21 1923  GLUCAP 151*   Lipid Profile: No results for input(s): CHOL, HDL, LDLCALC, TRIG, CHOLHDL, LDLDIRECT in the last 72 hours. Thyroid Function Tests: Recent Labs    06/23/21 0207  TSH 0.828   Anemia Panel: No results for input(s): VITAMINB12, FOLATE, FERRITIN, TIBC, IRON, RETICCTPCT in the last 72 hours. Sepsis Labs: Recent Labs  Lab 06/22/21 1945 06/23/21 0207  PROCALCITON   --  <0.10  LATICACIDVEN 1.9  --     Recent Results (from the past 240 hour(s))  Resp Panel by RT-PCR (Flu A&B, Covid) Nasopharyngeal Swab     Status: None   Collection Time: 06/22/21  6:39 PM   Specimen: Nasopharyngeal Swab; Nasopharyngeal(NP) swabs in vial transport medium  Result Value Ref Range Status   SARS Coronavirus 2 by RT PCR NEGATIVE NEGATIVE Final    Comment: (NOTE) SARS-CoV-2 target nucleic acids are NOT DETECTED.  The SARS-CoV-2 RNA is generally detectable in upper respiratory specimens during the acute phase of infection. The lowest concentration of SARS-CoV-2 viral copies this assay can detect is 138 copies/mL. A negative result does not preclude SARS-Cov-2 infection and should not be used as the sole basis for treatment or other patient management decisions. A negative result may occur with  improper specimen collection/handling, submission of specimen other than nasopharyngeal swab, presence of viral mutation(s) within the areas targeted by this assay, and inadequate number of viral copies(<138 copies/mL). A negative result must be combined with clinical observations, patient history, and epidemiological information. The expected result is Negative.  Fact Sheet for Patients:  06/25/21  Fact Sheet for Healthcare Providers:  06/24/21  This test is no t yet approved or cleared by the [employer] FDA and  has been authorized for detection and/or diagnosis of SARS-CoV-2 by FDA under an Emergency Use Authorization (EUA). This EUA will remain  in effect (meaning this test can be used) for the duration of the COVID-19 declaration under Section 564(b)(1) of the Act, 21 U.S.C.section 360bbb-3(b)(1), unless the authorization is terminated  or revoked sooner.       Influenza A by PCR NEGATIVE NEGATIVE Final   Influenza B by PCR NEGATIVE NEGATIVE Final    Comment: (NOTE) The Xpert Xpress  SARS-CoV-2/FLU/RSV plus assay is intended as an aid in the diagnosis of influenza from Nasopharyngeal swab specimens and should not be used as a sole basis for treatment. Nasal washings and aspirates are unacceptable for Xpert Xpress SARS-CoV-2/FLU/RSV testing.  Fact Sheet for Patients: [employer]it  Fact Sheet for  Healthcare Providers: SeriousBroker.it  This test is not yet approved or cleared by the [place] and has been authorized for detection and/or diagnosis of SARS-CoV-2 by FDA under an Emergency Use Authorization (EUA). This EUA will remain in effect (meaning this test can be used) for the duration of the COVID-19 declaration under Section 564(b)(1) of the Act, 21 U.S.C. section 360bbb-3(b)(1), unless the authorization is terminated or revoked.  Performed at [facility], [address], [place], [place] [address]   Blood culture (routine x 2)     Status: None (Preliminary result)   Collection Time: 06/22/21  7:45 PM   Specimen: BLOOD RIGHT ARM  Result Value Ref Range Status   Specimen Description BLOOD RIGHT ARM  Final   Special Requests   Final    BOTTLES DRAWN AEROBIC AND ANAEROBIC Blood Culture adequate volume   Culture   Final    NO GROWTH < 24 HOURS Performed at [facility], [address], [place], [place] [address]    Report Status PENDING  Incomplete  Blood culture (routine x 2)     Status: None (Preliminary result)   Collection Time: 06/22/21  7:46 PM   Specimen: BLOOD RIGHT HAND  Result Value Ref Range Status   Specimen Description BLOOD RIGHT HAND  Final   Special Requests   Final    BOTTLES DRAWN AEROBIC AND ANAEROBIC Blood Culture results may not be optimal due to an inadequate volume of blood received in culture bottles   Culture   Final    NO GROWTH < 24 HOURS Performed at [facility], [address], [place], [place] [address]    Report Status PENDING  Incomplete      Radiology Studies: DG Chest 1 View  Result Date: 06/22/2021 CLINICAL DATA:  Not eating for 2 days. EXAM: CHEST  1 VIEW COMPARISON:  Chest x-ray 09/02/2019. FINDINGS: Examination is limited secondary to patient rotation. The cardiomediastinal silhouette is grossly unchanged. The heart is enlarged the aorta is tortuous. There is no lung infiltrate, pleural effusion or pneumothorax. No acute fractures are seen. IMPRESSION: 1. Limited examination.  No definite acute cardiopulmonary process. Electronically Signed   By: Darliss Cheney M.D.   On: 06/22/2021 19:18   CT HEAD WO CONTRAST  Result Date: 06/22/2021 CLINICAL DATA:  Delirium EXAM: CT HEAD WITHOUT CONTRAST TECHNIQUE: Contiguous axial images were obtained from the base of the skull through the vertex without intravenous contrast. COMPARISON:  None. FINDINGS: Brain: Image quality degraded by significant motion. Moderate atrophy. Chronic microvascular ischemic change in the white matter and right basal ganglia. Negative for acute infarct, hemorrhage, mass Vascular: Negative for hyperdense vessel Skull: Negative Sinuses/Orbits: Negative Other: None IMPRESSION: Motion degraded study.  No acute abnormality Atrophy and chronic ischemia.  Chronic infarct right basal ganglia. Electronically Signed   By: Marlan Palau M.D.   On: 06/22/2021 19:24   CT Renal Stone Study  Result Date: 06/22/2021 CLINICAL DATA:  Flank pain, kidney stone suspected EXAM: CT ABDOMEN AND PELVIS WITHOUT CONTRAST TECHNIQUE: Multidetector CT imaging of the abdomen and pelvis was performed following the standard protocol without IV contrast. COMPARISON:  02/02/2020 FINDINGS: Lower chest: No acute abnormality Hepatobiliary: Calcification noted along the medial margin of the right hepatic lobe is stable since prior study, likely calcified lesion. Low-density lesion in the right hepatic lobe measures 4 cm, stable. Gallbladder unremarkable. Pancreas: No focal abnormality or ductal  dilatation. Spleen: No focal abnormality.  Normal size. Adrenals/Urinary Tract:  No renal or ureteral stones. No hydronephrosis. Adrenal glands unremarkable. Urinary bladder not well visualized due to beam hardening artifact in the pelvis. Stomach/Bowel: Stomach, large and small bowel grossly unremarkable. Vascular/Lymphatic: Calcified aorta and iliac vessels. No evidence of aneurysm or adenopathy. IVC filter noted within the infrarenal IVC. Reproductive: Numerous calcified fibroids in the uterus. No adnexal mass. Other: No free fluid or free air. Musculoskeletal: Prior right hip replacement. No acute bony abnormality. IMPRESSION: No renal or ureteral stones.  No hydronephrosis. Liver lesions are unchanged and difficult to characterize on this noncontrast study. Aortoiliac atherosclerosis. Fibroid uterus. No acute findings. Electronically Signed   By: Charlett Nose M.D.   On: 06/22/2021 23:42     LOS: 2 days   Lanae Boast, MD [facility]  06/24/2021, 9:41 AM

## 2021-06-24 NOTE — Progress Notes (Signed)
Physical Therapy Treatment Patient Details Name: Monique Petersen MRN: [id number] DOB: 12/07/1915 Today's Date: 06/24/2021   History of Present Illness Pt is a 87 y/o female who was brought from nursing facility with 2 days of progressive somnolence, diminished interaction, declining oral intake relative to her baseline mental and functional status which she is reportedly alert and able to feed herself. In the ED work-up showed UA concerning for UTI. CT head revealed no acute finding. PMH significant for chronic systolic CHF, dementia, depression.    PT Comments    Pt remains lethargic with eyes closed throughout session. Pt acknowledges when therapist saying her name at end of session, but not any other time throughout session. Attempted to facilitate ADL by assisting hand with wash cloth up to her face, with cues to wipe mouth. No activation noted. Up to max assist required at all times for sitting balance. Pt bending knees up at end of session and scratching behind thigh on the R - otherwise no purposeful movement noted. Will continue to follow.    Recommendations for follow up therapy are one component of a multi-disciplinary discharge planning process, led by the attending physician.  Recommendations may be updated based on patient status, additional functional criteria and insurance authorization.  Follow Up Recommendations  Skilled nursing-short term rehab (<3 hours/day)     Assistance Recommended at Discharge Frequent or constant Supervision/Assistance  Equipment Recommendations  None recommended by PT    Recommendations for Other Services       Precautions / Restrictions Precautions Precautions: Fall Restrictions Weight Bearing Restrictions: No     Mobility  Bed Mobility Overal bed mobility: Needs Assistance Bed Mobility: Rolling;Sidelying to Sit;Sit to Supine Rolling: Total assist;+2 for physical assistance Sidelying to sit: Total assist;+2 for physical assistance    Sit to supine: Total assist;+2 for physical assistance   General bed mobility comments: +2 for all aspects of bed mobility. Sat EOB for ~10 minutes with up to max assist for sitting balance.    Transfers                   General transfer comment: Unable to assess    Ambulation/Gait               General Gait Details: Unable to assess   Stairs             Wheelchair Mobility    Modified Rankin (Stroke Patients Only) Modified Rankin (Stroke Patients Only) Pre-Morbid Rankin Score: Moderately severe disability Modified Rankin: Severe disability     Balance Overall balance assessment:  (Unable to assess)                                          Cognition Arousal/Alertness: Lethargic Behavior During Therapy: Flat affect Overall Cognitive Status: History of cognitive impairments - at baseline                                 General Comments: Pt kept her eyes closed then entire session, did not verbally speak other than to say "oww" x2 when PROM her LUE. Did not follow any commands at this time, except potentially attempting to squeeze her R hand.        Exercises Other Exercises Other Exercises: PROM of BUE x5, shoulder flexion, elbow flexion and extension, Wrist flexion and  extention, and digit composite flexion and extension.    General Comments General comments (skin integrity, edema, etc.): Pt not following commands or opening her eyes.      Pertinent Vitals/Pain Pain Assessment: Faces Faces Pain Scale: No hurt Negative Vocalization: occasional moan/groan, low speech, negative/disapproving quality Facial Expression: facial grimacing Body Language: relaxed Consolability: no need to console Pain Location: LUE with PROM Pain Descriptors / Indicators: Grimacing;Moaning Pain Intervention(s): Limited activity within patient's tolerance;Monitored during session;Repositioned    Home Living Family/patient expects  to be discharged to:: Skilled nursing facility                        Prior Function            PT Goals (current goals can now be found in the care plan section) Acute Rehab PT Goals Patient Stated Goal: None stated PT Goal Formulation: Patient unable to participate in goal setting Time For Goal Achievement: 07/07/21 Potential to Achieve Goals: Fair Progress towards PT goals: Progressing toward goals    Frequency    Min 2X/week      PT Plan Current plan remains appropriate    Co-evaluation              AM-PAC PT "6 Clicks" Mobility   Outcome Measure  Help needed turning from your back to your side while in a flat bed without using bedrails?: Total Help needed moving from lying on your back to sitting on the side of a flat bed without using bedrails?: Total Help needed moving to and from a bed to a chair (including a wheelchair)?: Total Help needed standing up from a chair using your arms (e.g., wheelchair or bedside chair)?: Total Help needed to walk in hospital room?: Total Help needed climbing 3-5 steps with a railing? : Total 6 Click Score: 6    End of Session   Activity Tolerance: Patient limited by lethargy Patient left: in bed;with call bell/phone within reach;with bed alarm set Nurse Communication: Mobility status PT Visit Diagnosis: Muscle weakness (generalized) (M62.81);Other abnormalities of gait and mobility (R26.89)     Time: 1025-8527 PT Time Calculation (min) (ACUTE ONLY): 16 min  Charges:  $Therapeutic Activity: 8-22 mins                     [name], PT, DPT Acute Rehabilitation Services Pager: [phone] Office: [phone]    [name] 06/24/2021, 2:37 PM

## 2021-06-24 NOTE — Progress Notes (Signed)
Speech Language Pathology Treatment: Dysphagia  Patient Details Name: Monique Petersen MRN: [id number] DOB: 07/20/1916 Today's Date: 06/24/2021 Time: 1117-3567 SLP Time Calculation (min) (ACUTE ONLY): 8 min  Assessment / Plan / Recommendation Clinical Impression  Therapist provided tactile and verbal stimuli for consumption of snack. Kept eyes closed, cold cloth gently washed face. She did not seem to respond to therapist's questions but produced several vocalizations/sounds in response to stimulation. With multiple trials she accepted spoon with applesauce, and straw with water piped in front of oral cavity. Unable to accept from straw. She held boluses and was unable to propel despite assist with spoon. May have been lethargy at this particular time. Recommend she continue with puree and thin at discretion of staff re: alertness. Observe closely for swallow. No follow up needed at this time.    HPI HPI: Monique Petersen, 87 y.o. female with PMH of chronic systolic CHF, GERD, dementia, depression brought from nursing facility with 2 days of progressive somnolence diminished interaction declining oral intake relative to her baseline mental and functional status which she is reportedly alert and able to feed herself.  In the ED work-up showed stable vital signs, COVID-19 negative UA concerning for UTI CT head and CT head yesterday as below with no acute finding-      SLP Plan         Recommendations for follow up therapy are one component of a multi-disciplinary discharge planning process, led by the attending physician.  Recommendations may be updated based on patient status, additional functional criteria and insurance authorization.    Recommendations  Diet recommendations: Dysphagia 1 (puree);Thin liquid Liquids provided via: Straw Medication Administration: Crushed with puree Supervision: Staff to assist with self feeding Postural Changes and/or Swallow Maneuvers: Seated upright 90  degrees                Oral Care Recommendations: Oral care BID Follow Up Recommendations: No SLP follow up       GO                Royce Macadamia  06/24/2021, 3:10 PM

## 2021-06-24 NOTE — Progress Notes (Signed)
CCMD reported pt had 7 beat run of VT.

## 2021-06-25 ENCOUNTER — Inpatient Hospital Stay (HOSPITAL_COMMUNITY): Payer: Medicare Other

## 2021-06-25 LAB — CBC
HCT: 43 % (ref 36.0–46.0)
Hemoglobin: 13.6 g/dL (ref 12.0–15.0)
MCH: 29.9 pg (ref 26.0–34.0)
MCHC: 31.6 g/dL (ref 30.0–36.0)
MCV: 94.5 fL (ref 80.0–100.0)
Platelets: 166 10*3/uL (ref 150–400)
RBC: 4.55 MIL/uL (ref 3.87–5.11)
RDW: 13 % (ref 11.5–15.5)
WBC: 6.9 10*3/uL (ref 4.0–10.5)
nRBC: 0 % (ref 0.0–0.2)

## 2021-06-25 LAB — GLUCOSE, CAPILLARY: Glucose-Capillary: 127 mg/dL — ABNORMAL HIGH (ref 70–99)

## 2021-06-25 LAB — COMPREHENSIVE METABOLIC PANEL
ALT: 31 U/L (ref 0–44)
AST: 38 U/L (ref 15–41)
Albumin: 3.2 g/dL — ABNORMAL LOW (ref 3.5–5.0)
Alkaline Phosphatase: 80 U/L (ref 38–126)
Anion gap: 10 (ref 5–15)
BUN: 24 mg/dL — ABNORMAL HIGH (ref 8–23)
CO2: 22 mmol/L (ref 22–32)
Calcium: 9.5 mg/dL (ref 8.9–10.3)
Chloride: 118 mmol/L — ABNORMAL HIGH (ref 98–111)
Creatinine, Ser: 1.22 mg/dL — ABNORMAL HIGH (ref 0.44–1.00)
GFR, Estimated: 39 mL/min — ABNORMAL LOW (ref >60)
Glucose, Bld: 138 mg/dL — ABNORMAL HIGH (ref 70–99)
Potassium: 3.7 mmol/L (ref 3.5–5.1)
Sodium: 150 mmol/L — ABNORMAL HIGH (ref 135–145)
Total Bilirubin: 1.5 mg/dL — ABNORMAL HIGH (ref 0.3–1.2)
Total Protein: 6.9 g/dL (ref 6.5–8.1)

## 2021-06-25 LAB — BRAIN NATRIURETIC PEPTIDE: B Natriuretic Peptide: 3771.5 pg/mL — ABNORMAL HIGH (ref 0.0–100.0)

## 2021-06-25 LAB — MAGNESIUM: Magnesium: 2.5 mg/dL — ABNORMAL HIGH (ref 1.7–2.4)

## 2021-06-25 MED ORDER — DEXTROSE 5 % IV SOLN: INTRAVENOUS | Status: DC

## 2021-06-25 MED ORDER — FUROSEMIDE 20 MG PO TABS: 20.0000 mg | ORAL_TABLET | Freq: Every day | ORAL | Status: DC

## 2021-06-25 MED ORDER — POTASSIUM CHLORIDE 10 MEQ/100ML IV SOLN
10.0000 meq | Freq: Once | INTRAVENOUS | Status: AC
  Administered 2021-06-25: 10 meq via INTRAVENOUS
  Filled 2021-06-25: qty 100

## 2021-06-25 NOTE — Progress Notes (Signed)
   Palliative Medicine Inpatient Follow Up Note  Consulting Provider: Antonieta Pert, MD   Reason for consult:   [facility] Consult  Reason for Consult? GOC    HPI:  Per intake H&P --> Monique Petersen, 87 y.o. female with PMH of chronic systolic CHF, GERD, dementia, depression brought from nursing facility with 2 days of progressive somnolence diminished interaction declining oral intake relative to her baseline mental and functional status which she is reportedly alert and able to feed herself. Has been identified to have a UTI which is being treated.    Palliative care has been asked to get involved in the setting of advanced age and chronic co-morbidities to discuss goals of care.  Today's Discussion (06/25/2021):  *Please note that this is a verbal dictation therefore any spelling or grammatical errors are due to the "[employer] One" system interpretation.  Chart reviewed.  I met at bedside with patients PM RN who shares that Monique Petersen seems to be a bit better from her observation. She has been more responsive than prior.  Upon assessment Monique Petersen did seem somnolent though would arouse when stimulated. She has no s/s of distress per PAIN-AD.   No family was present at bedside this morning though they will be called and updated. Plan for continued tx of lower UTI and transition back to [facility] when medically optimized.  Objective Assessment: Vital Signs Vitals:   06/25/21 0022 06/25/21 0429  BP: 131/87 (!) 152/91  Pulse: 87 83  Resp: 20 16  Temp: 97.6 F (36.4 C) 98.9 F (37.2 C)  SpO2: 99% 98%   No intake or output data in the 24 hours ending 06/25/21 0721 Last Weight  Most recent update: 06/23/2021  5:20 AM    Weight  67.3 kg (148 lb 5.9 oz)            Gen:  Elderly AA F in NAD HEENT: Dry mucous membranes CV: Regular rate and rhythm  PULM:  On RA ABD: soft/nontender  EXT: (+) BLE trace edema  Neuro: Somnolent this  morning, does awaken when stimulated  SUMMARY OF RECOMMENDATIONS   DNAR/DNI  Will place a gold DNR on the chart   Treat what is treatable   Goal: Treat UTI and patient to return to [facility] support discussed with daughter   Ongoing incremental Palliative support until discharge  Time Spent: 25 Greater than 50% of the time was spent in counseling and coordination of care ______________________________________________________________________________________ [facility] Team Team Cell Phone: [phone] Please utilize secure chat with additional questions, if there is no response within 30 minutes please call the above phone number  Palliative Medicine Team providers are available by phone from 7am to 7pm daily and can be reached through the team cell phone.  Should this patient require assistance outside of these hours, please call the patient's attending physician.

## 2021-06-25 NOTE — Progress Notes (Signed)
PROGRESS NOTE    Monique Petersen  RWE:[id number] DOB: 02-24-1916 DOA: 06/22/2021 PCP: Charlynne Pander, MD   Chief Complaint  Patient presents with   Failure To Thrive  Brief Narrative/Hospital Course:  Monique Petersen, 87 y.o. female with PMH of chronic systolic CHF, GERD, dementia, depression brought from nursing facility with 2 days of progressive somnolence diminished interaction declining oral intake relative to her baseline mental and functional status which she is reportedly alert and able to feed herself. In the ED work-up showed stable vital signs, COVID-19 negative UA concerning for UTI CT head and CT head yesterday as below with no acute finding- CT head- Motion degraded study.  No acute abnormality Atrophy and chronic ischemia.  Chronic infarct right basal ganglia CT renal study : No renal or ureteral stones.  No hydronephrosis. Liver lesions are unchanged and difficult to characterize on this noncontrast study. Aortoiliac atherosclerosis. Fibroid uterus EKG prolonged QTC 518, patient was given ceftriaxone fluid and admitted   Subjective: Patient continues to have poor oral intake nursing reports able to eat some while family at the bedside in the afternoon.  Sodium creeping up and fluid changed. Remains nonverbal mumbling words does not follow any commands.  Assessment & Plan:  Acute lower UTI: Continue empiric ceftriaxone.  Blood culture no growth so far.  Completed a course of antibiotics.    Acute metabolic encephalopathy due to #1 Background history of dementia: Patient remains lethargic current antibiotics, hydration, supportive care care delirium precaution fall precaution.  Minimize psychotropic medications.  Speech eval done and now on Dysphagia 1 diet.  She is on Seroquel and Ativan-hopefully we can resume once less lethargic.  TSH CK procalcitonin normal.  MMA pending  Hypernatremia due to free water deficit due to poor oral intake.  Discontinue oral switch to D5  water Recent Labs  Lab 06/22/21 1945 06/22/21 1959 06/23/21 0207 06/24/21 0149 06/25/21 0209  NA 142 144 144 145 150*     Current  HFrEF: Appears compensated.  Chest x-ray no acute finding BNP 6195>0932  On high-dose Lasix Aldactone and potassium normally-, on IV fluid hydration gently.  No crackles on exam monitor serially.  Monitor BMP closely ,watch for fluid overload. Net IO Since Admission: 945.54 mL [06/25/21 1115]   AKI with mild metabolic acidosis bicarb 19.  Overall creatinine improved to 1.2, gentle fluids to 2 high sodium level and poor intake.  Net IO Since Admission: 945.54 mL [06/25/21 1112]  Recent Labs  Lab 06/22/21 1945 06/23/21 0207 06/24/21 0149 06/25/21 0209  BUN 29* 29* 25* 24*  CREATININE 1.42* 1.29* 1.22* 1.22*     Mildly elevated total bili: Monitor Prolonged IZT:IWPYK QT prolonging medication, monitor GAD/depression: On Seroquel, Zoloft and Ativan held on admission- still lethargics- hold off on resuming it  Goals of care DNR, appreciate palliative care INPUTS.Outpatient palliative care follow-up and planning to return back to skilled nursing facility. Overall prognosis does not appear bright  DVT prophylaxis: enoxaparin (LOVENOX) injection 30 mg Start: 06/24/21 1000 SCDs Start: 06/22/21 2308 Code Status:   Code Status: DNR Family Communication: plan of care discussed with patient/ RN at bedside. Status is: Inpatient  Remains inpatient appropriate because: For ongoing management of UTI and encephalopathy Disposition: Currently not  medically stable for discharge. Anticipated Disposition: Plan to discharge back to facility once mentation improves, once electrolytes improves.  Objective: Vitals last 24 hrs: Vitals:   06/24/21 2035 06/25/21 0022 06/25/21 0429 06/25/21 0903  BP: 131/87 131/87 (!) 152/91 138/70  Pulse: 91 87  83 90  Resp: 20 20 16 15   Temp: 98.1 F (36.7 C) 97.6 F (36.4 C) 98.9 F (37.2 C) 98 F (36.7 C)  TempSrc: Oral Oral  Oral Oral  SpO2: 97% 99% 98% 99%  Weight:       Weight change:  No intake or output data in the 24 hours ending 06/25/21 1112  Net IO Since Admission: 945.54 mL [06/25/21 1112]   Physical Examination: General exam: Nonverbal mumbling words does not follow commands, elderly, frail, ill looking.   HEENT:Oral mucosa moist, Ear/Nose WNL grossly, dentition normal. Respiratory system: bilaterally clear, no use of accessory muscle Cardiovascular system: S1 & S2 +, No JVD,. Gastrointestinal system: Abdomen soft,NT,ND, BS+ Nervous System: Mumbling words nonverbal does not follow commands.  In fetal position  Extremities: no edema, distal peripheral pulses palpable.  Skin: No rashes,no icterus. MSK: Normal muscle bulk,tone, power   Medications reviewed:Scheduled Meds:  chlorhexidine  15 mL Mouth Rinse BID   enoxaparin (LOVENOX) injection  30 mg Subcutaneous Daily   mouth rinse  15 mL Mouth Rinse q12n4p   Continuous Infusions:  cefTRIAXone (ROCEPHIN)  IV 1 g (06/24/21 2024)   dextrose 30 mL/hr at 06/25/21 0656   Diet Order             DIET - DYS 1 Room service appropriate? Yes; Fluid consistency: Thin  Diet effective now                  Weight change:   Wt Readings from Last 3 Encounters:  06/23/21 67.3 kg     Consultants: PMT Procedures:see note Antimicrobials: Anti-infectives (From admission, onward)    Start     Dose/Rate Route Frequency Ordered Stop   06/23/21 2000  cefTRIAXone (ROCEPHIN) 1 g in sodium chloride 0.9 % 100 mL IVPB        1 g 200 mL/hr over 30 Minutes Intravenous Every 24 hours 06/22/21 2311     06/22/21 2245  cefTRIAXone (ROCEPHIN) 1 g in sodium chloride 0.9 % 100 mL IVPB        1 g 200 mL/hr over 30 Minutes Intravenous  Once 06/22/21 2237 06/22/21 2356      Culture/Microbiology    Component Value Date/Time   SDES BLOOD RIGHT HAND 06/22/2021 1946   SPECREQUEST  06/22/2021 1946    BOTTLES DRAWN AEROBIC AND ANAEROBIC Blood Culture results may  not be optimal due to an inadequate volume of blood received in culture bottles   CULT  06/22/2021 1946    NO GROWTH 2 DAYS Performed at [facility], [address], [place], [place]    REPTSTATUS PENDING 06/22/2021 1946    Other culture-see note  Unresulted Labs (From admission, onward)     Start     Ordered   06/24/21 0500  CBC  Daily,   R      06/23/21 1030   06/24/21 0500  Comprehensive metabolic panel  Daily,   R      06/23/21 1030   06/22/21 2338  Methylmalonic acid, serum  Add-on,   AD        06/22/21 2337   06/22/21 2330  Urine Culture  Add-on,   AD       Question:  Indication  Answer:  Dysuria   06/22/21 2329           Data Reviewed: I have personally reviewed following labs and imaging studies CBC: Recent Labs  Lab 06/22/21 1945 06/22/21 1959 06/23/21 0207 06/24/21  0149 06/25/21 0209  WBC 7.3  --  6.3 7.0 6.9  NEUTROABS 4.7  --  4.2  --   --   HGB 15.6* 16.3* 14.2 13.2 13.6  HCT 47.3* 48.0* 43.5 41.9 43.0  MCV 93.1  --  94.0 95.0 94.5  PLT 214  --  167 171 166    Basic Metabolic Panel: Recent Labs  Lab 06/22/21 1945 06/22/21 1959 06/23/21 0207 06/24/21 0149 06/25/21 0209  NA 142 144 144 145 150*  K 4.1 4.2 4.0 3.8 3.7  CL 106  --  112* 114* 118*  CO2 22  --  19* 21* 22  GLUCOSE 135*  --  109* 97 138*  BUN 29*  --  29* 25* 24*  CREATININE 1.42*  --  1.29* 1.22* 1.22*  CALCIUM 9.8  --  9.2 9.3 9.5  MG  --   --  2.4  --   --     GFR: CrCl cannot be calculated (Unknown ideal weight.). Liver Function Tests: Recent Labs  Lab 06/22/21 1945 06/23/21 0207 06/24/21 0149 06/25/21 0209  AST 20 16 18  38  ALT 24 19 16 31   ALKPHOS 80 67 65 80  BILITOT 2.1* 1.6* 1.9* 1.5*  PROT 7.9 6.6 6.5 6.9  ALBUMIN 3.8 3.1* 3.1* 3.2*    No results for input(s): LIPASE, AMYLASE in the last 168 hours. Recent Labs  Lab 06/22/21 1945  AMMONIA 15    Coagulation Profile: No results for input(s): INR, PROTIME in the last 168  hours. Cardiac Enzymes: Recent Labs  Lab 06/23/21 0207  CKTOTAL 92    BNP (last 3 results) No results for input(s): PROBNP in the last 8760 hours. HbA1C: No results for input(s): HGBA1C in the last 72 hours. CBG: Recent Labs  Lab 06/22/21 1923  GLUCAP 151*    Lipid Profile: No results for input(s): CHOL, HDL, LDLCALC, TRIG, CHOLHDL, LDLDIRECT in the last 72 hours. Thyroid Function Tests: Recent Labs    06/23/21 0207  TSH 0.828    Anemia Panel: No results for input(s): VITAMINB12, FOLATE, FERRITIN, TIBC, IRON, RETICCTPCT in the last 72 hours. Sepsis Labs: Recent Labs  Lab 06/22/21 1945 06/23/21 0207  PROCALCITON  --  <0.10  LATICACIDVEN 1.9  --      Recent Results (from the past 240 hour(s))  Resp Panel by RT-PCR (Flu A&B, Covid) Nasopharyngeal Swab     Status: None   Collection Time: 06/22/21  6:39 PM   Specimen: Nasopharyngeal Swab; Nasopharyngeal(NP) swabs in vial transport medium  Result Value Ref Range Status   SARS Coronavirus 2 by RT PCR NEGATIVE NEGATIVE Final    Comment: (NOTE) SARS-CoV-2 target nucleic acids are NOT DETECTED.  The SARS-CoV-2 RNA is generally detectable in upper respiratory specimens during the acute phase of infection. The lowest concentration of SARS-CoV-2 viral copies this assay can detect is 138 copies/mL. A negative result does not preclude SARS-Cov-2 infection and should not be used as the sole basis for treatment or other patient management decisions. A negative result may occur with  improper specimen collection/handling, submission of specimen other than nasopharyngeal swab, presence of viral mutation(s) within the areas targeted by this assay, and inadequate number of viral copies(<138 copies/mL). A negative result must be combined with clinical observations, patient history, and epidemiological information. The expected result is Negative.  Fact Sheet for Patients:  BloggerCourse.com  Fact  Sheet for Healthcare Providers:  SeriousBroker.it  This test is no t yet approved or cleared by the [place]  FDA and  has been authorized for detection and/or diagnosis of SARS-CoV-2 by FDA under an Emergency Use Authorization (EUA). This EUA will remain  in effect (meaning this test can be used) for the duration of the COVID-19 declaration under Section 564(b)(1) of the Act, 21 U.S.C.section 360bbb-3(b)(1), unless the authorization is terminated  or revoked sooner.       Influenza A by PCR NEGATIVE NEGATIVE Final   Influenza B by PCR NEGATIVE NEGATIVE Final    Comment: (NOTE) The Xpert Xpress SARS-CoV-2/FLU/RSV plus assay is intended as an aid in the diagnosis of influenza from Nasopharyngeal swab specimens and should not be used as a sole basis for treatment. Nasal washings and aspirates are unacceptable for Xpert Xpress SARS-CoV-2/FLU/RSV testing.  Fact Sheet for Patients: BloggerCourse.com  Fact Sheet for Healthcare Providers: SeriousBroker.it  This test is not yet approved or cleared by the [place] FDA and has been authorized for detection and/or diagnosis of SARS-CoV-2 by FDA under an Emergency Use Authorization (EUA). This EUA will remain in effect (meaning this test can be used) for the duration of the COVID-19 declaration under Section 564(b)(1) of the Act, 21 U.S.C. section 360bbb-3(b)(1), unless the authorization is terminated or revoked.  Performed at [facility], [address], [place], [place] [address]   Blood culture (routine x 2)     Status: None (Preliminary result)   Collection Time: 06/22/21  7:45 PM   Specimen: BLOOD RIGHT ARM  Result Value Ref Range Status   Specimen Description BLOOD RIGHT ARM  Final   Special Requests   Final    BOTTLES DRAWN AEROBIC AND ANAEROBIC Blood Culture adequate volume   Culture   Final    NO GROWTH 2 DAYS Performed at [facility], [address], [place], [place] [address]    Report Status PENDING  Incomplete  Blood culture (routine x 2)     Status: None (Preliminary result)   Collection Time: 06/22/21  7:46 PM   Specimen: BLOOD RIGHT HAND  Result Value Ref Range Status   Specimen Description BLOOD RIGHT HAND  Final   Special Requests   Final    BOTTLES DRAWN AEROBIC AND ANAEROBIC Blood Culture results may not be optimal due to an inadequate volume of blood received in culture bottles   Culture   Final    NO GROWTH 2 DAYS Performed at [facility], [address], [place], [place] [address]    Report Status PENDING  Incomplete      Radiology Studies: No results found.   LOS: 3 days   Lanae Boast, MD [facility]  06/25/2021, 11:12 AM

## 2021-06-25 NOTE — Progress Notes (Signed)
Nursing notified via telemetry of cardiac event. Report of 18 beat run of ventricular tachycardia. Patient assessed and did not appear to by symptomatic, patient mental status appeared to be unchanged from baseline. Attending physician notified of cardiac event.

## 2021-06-26 LAB — COMPREHENSIVE METABOLIC PANEL
ALT: 23 U/L (ref 0–44)
AST: 21 U/L (ref 15–41)
Albumin: 3.1 g/dL — ABNORMAL LOW (ref 3.5–5.0)
Alkaline Phosphatase: 67 U/L (ref 38–126)
Anion gap: 11 (ref 5–15)
BUN: 21 mg/dL (ref 8–23)
CO2: 21 mmol/L — ABNORMAL LOW (ref 22–32)
Calcium: 9.3 mg/dL (ref 8.9–10.3)
Chloride: 120 mmol/L — ABNORMAL HIGH (ref 98–111)
Creatinine, Ser: 1.14 mg/dL — ABNORMAL HIGH (ref 0.44–1.00)
GFR, Estimated: 42 mL/min — ABNORMAL LOW (ref >60)
Glucose, Bld: 126 mg/dL — ABNORMAL HIGH (ref 70–99)
Potassium: 3.7 mmol/L (ref 3.5–5.1)
Sodium: 152 mmol/L — ABNORMAL HIGH (ref 135–145)
Total Bilirubin: 1.3 mg/dL — ABNORMAL HIGH (ref 0.3–1.2)
Total Protein: 6.8 g/dL (ref 6.5–8.1)

## 2021-06-26 LAB — CBC
HCT: 43.5 % (ref 36.0–46.0)
Hemoglobin: 13.9 g/dL (ref 12.0–15.0)
MCH: 30.2 pg (ref 26.0–34.0)
MCHC: 32 g/dL (ref 30.0–36.0)
MCV: 94.6 fL (ref 80.0–100.0)
Platelets: 168 10*3/uL (ref 150–400)
RBC: 4.6 MIL/uL (ref 3.87–5.11)
RDW: 13.1 % (ref 11.5–15.5)
WBC: 6.4 10*3/uL (ref 4.0–10.5)
nRBC: 0 % (ref 0.0–0.2)

## 2021-06-26 LAB — RESP PANEL BY RT-PCR (FLU A&B, COVID) ARPGX2
Influenza A by PCR: NEGATIVE
Influenza B by PCR: NEGATIVE
SARS Coronavirus 2 by RT PCR: NEGATIVE

## 2021-06-26 LAB — METHYLMALONIC ACID, SERUM: Methylmalonic Acid, Quantitative: 367 nmol/L (ref 0–378)

## 2021-06-26 LAB — GLUCOSE, CAPILLARY: Glucose-Capillary: 146 mg/dL — ABNORMAL HIGH (ref 70–99)

## 2021-06-26 NOTE — Progress Notes (Signed)
Report given to Gueshaun at [facility]. Familiar with patient. Undated on level of decline, DNR code status, and medication changes. SW to set up transportation. PIV remaining in place per receiving facility's request. Fluids here d/c'd. DNR paperwork signed by provider and placed with discharge paperwork.

## 2021-06-26 NOTE — Progress Notes (Signed)
[facility] [phone]  [facility] Liaison Note  Received request from Transitions of Care Manager Shanda Bumps for hospice services at LTC after discharge. Chart and patient information under review by [facility] physician. Hospice eligibility has been approved.   Spoke with daughter Corrie Dandy to initiate education related to hospice philosophy, services, and team approach to care. Mary verbalized understanding of information given.    Please send signed and completed DNR with patient/family. Please provide prescriptions at discharge as needed to ensure ongoing symptom management.    [facility] information and contact numbers given to daughter Corrie Dandy Above information shared with Shanda Bumps, TOC.    Please call with any questions/concerns.    Thank you for the opportunity to participate in this patient's care.  Ardis Rowan [facility] Liaison [phone]

## 2021-06-26 NOTE — Discharge Summary (Signed)
Physician Discharge Summary  [name] ZOX:[id number] DOB: 1915-09-30 DOA: 06/22/2021  PCP: Charlynne Pander, MD  Admit date: 06/22/2021 Discharge date: 06/26/2021  Admitted From: NH Disposition:  NH W/ HOSPICE  Recommendations for Outpatient Follow-up:  Follow up hospice at the facility   Home Health:NO  Equipment/Devices: NONE  Discharge Condition: Stable Code Status:   Code Status: DNR Diet recommendation:  Diet Order             DIET - DYS 1 Room service appropriate? Yes; Fluid consistency: Thin  Diet effective now                 Brief/Interim Summary: Monique Petersen, 87 y.o. female with PMH of chronic systolic CHF, GERD, dementia, depression brought from nursing facility with 2 days of progressive somnolence diminished interaction declining oral intake relative to her baseline mental and functional status which she is reportedly alert and able to feed herself. In the ED work-up showed stable vital signs, COVID-19 negative UA concerning for UTI CT head and CT head yesterday as below with no acute finding- CT head- Motion degraded study.  No acute abnormality Atrophy and chronic ischemia.  Chronic infarct right basal ganglia CT renal study : No renal or ureteral stones.  No hydronephrosis. Liver lesions are unchanged and difficult to characterize on this noncontrast study. Aortoiliac atherosclerosis. Fibroid uterus EKG prolonged QTC 518, patient was given ceftriaxone fluid and admitted Patient continued to remain encephalopathic with poor oral intake and has not eaten much now becoming more hyponatremic managed IV fluids antibiotics and has not improved.  Palliative care has seen the patient changed to DNR.  Given her advanced age in the setting of dementia now with encephalopathy poor oral intake deemed hospice appropriate and she is being discharged back to the facility with hospice care  Discharge Diagnoses:   Acute lower WJX:BJYNW culture no growth,completing  course.Dicontinue antibiotics upon discharge.    Acute metabolic encephalopathy due to #1 Background history of dementia: Patient is still somnolent with poor oral intake -managed with iv fluid hydration cautiously given CHF.  On dysphagia 1 diet continue as tolerated. She is on Seroquel and Ativan unnable to be resumed as not takign po   Hypernatremia due to free water deficit due from poor oral intake.  Poor prognosis sodium continues to trend managed with IV fluid while here Recent Labs  Lab 06/22/21 1959 06/23/21 0207 06/24/21 0149 06/25/21 0209 06/26/21 0157  NA 144 144 145 150* 152*   Chronic HFrEF:Appears compensated.BNP is elevated, hydrating cautiously, x-ray with vascular congestion.  Monitor closely for fluid overload, crackles or any respiratory symptoms unable to take orally and she is on Aldactone and Lasix at [facility].Net IO Since Admission: 900.09 mL [06/26/21 0742]   AKI with mild metabolic acidosis bicarb 19.  Creatinine improved to 1.1 with IV fluid -but at risk of further worsening of renal failure given poor intake.  Recent Labs  Lab 06/22/21 1945 06/23/21 0207 06/24/21 0149 06/25/21 0209 06/26/21 0157  BUN 29* 29* 25* 24* 21  CREATININE 1.42* 1.29* 1.22* 1.22* 1.14*   Mildly elevated total bili: Monitor Prolonged GNF:AOZHY QT prolonging medication, monitor GAD/depression: On Seroquel, Zoloft and Ativan held on admission- still lethargics- hold off on resuming it  Goals of care QMV:HQIO prognosis given her advanced age dementia now with encephalopathy not eating.Patient is hospice appropriate she is being discharged back to facility with hospice.  Spoke with patient's daughter and she is in agreement.  Consults: pmt  Subjective: Somnolent.  Discharge Exam: Vitals:   06/26/21 0403 06/26/21 0800  BP: 137/84 (!) 149/77  Pulse: 85 85  Resp: 18 (!) 28  Temp: 98.9 F (37.2 C) 98.7 F (37.1 C)  SpO2: 97% 97%   General: Pt is alert, awake, not in acute  distress Cardiovascular: RRR, S1/S2 +, no rubs, no gallops Respiratory: CTA bilaterally, no wheezing, no rhonchi Abdominal: Soft, NT, ND, bowel sounds + Extremities: no edema, no cyanosis  Discharge Instructions  Discharge Instructions     Discharge instructions   Complete by: As directed    Initiate hospice care at the nursing facility      Allergies as of 06/26/2021   No Known Allergies      Medication List     STOP taking these medications    furosemide 80 MG tablet Commonly known as: LASIX   potassium chloride SA 20 MEQ tablet Commonly known as: KLOR-CON   QUEtiapine 50 MG tablet Commonly known as: SEROQUEL   spironolactone 25 MG tablet Commonly known as: ALDACTONE       TAKE these medications    acetaminophen 325 MG tablet Commonly known as: TYLENOL Take 650 mg by mouth in the morning and at bedtime.   docusate sodium 100 MG capsule Commonly known as: COLACE Take 100 mg by mouth daily.   LORazepam 0.5 MG tablet Commonly known as: ATIVAN Take 0.5 mg by mouth every 8 (eight) hours.   melatonin 3 MG Tabs tablet Take 3 mg by mouth at bedtime.   Sennosides 8.6 MG Caps Take 8.6 mg by mouth daily.   sertraline 25 MG tablet Commonly known as: ZOLOFT Take 25 mg by mouth daily.   tamsulosin 0.4 MG Caps capsule Commonly known as: FLOMAX Take 0.4 mg by mouth daily.        No Known Allergies  The results of significant diagnostics from this hospitalization (including imaging, microbiology, ancillary and laboratory) are listed below for reference.    Microbiology: Recent Results (from the past 240 hour(s))  Resp Panel by RT-PCR (Flu A&B, Covid) Nasopharyngeal Swab     Status: None   Collection Time: 06/22/21  6:39 PM   Specimen: Nasopharyngeal Swab; Nasopharyngeal(NP) swabs in vial transport medium  Result Value Ref Range Status   SARS Coronavirus 2 by RT PCR NEGATIVE NEGATIVE Final    Comment: (NOTE) SARS-CoV-2 target nucleic acids are NOT  DETECTED.  The SARS-CoV-2 RNA is generally detectable in upper respiratory specimens during the acute phase of infection. The lowest concentration of SARS-CoV-2 viral copies this assay can detect is 138 copies/mL. A negative result does not preclude SARS-Cov-2 infection and should not be used as the sole basis for treatment or other patient management decisions. A negative result may occur with  improper specimen collection/handling, submission of specimen other than nasopharyngeal swab, presence of viral mutation(s) within the areas targeted by this assay, and inadequate number of viral copies(<138 copies/mL). A negative result must be combined with clinical observations, patient history, and epidemiological information. The expected result is Negative.  Fact Sheet for Patients:  BloggerCourse.com  Fact Sheet for Healthcare Providers:  SeriousBroker.it  This test is no t yet approved or cleared by the [place] FDA and  has been authorized for detection and/or diagnosis of SARS-CoV-2 by FDA under an Emergency Use Authorization (EUA). This EUA will remain  in effect (meaning this test can be used) for the duration of the COVID-19 declaration under Section 564(b)(1) of the Act, 21 U.S.C.section 360bbb-3(b)(1), unless the authorization is terminated  or revoked sooner.       Influenza A by PCR NEGATIVE NEGATIVE Final   Influenza B by PCR NEGATIVE NEGATIVE Final    Comment: (NOTE) The Xpert Xpress SARS-CoV-2/FLU/RSV plus assay is intended as an aid in the diagnosis of influenza from Nasopharyngeal swab specimens and should not be used as a sole basis for treatment. Nasal washings and aspirates are unacceptable for Xpert Xpress SARS-CoV-2/FLU/RSV testing.  Fact Sheet for Patients: BloggerCourse.com  Fact Sheet for Healthcare Providers: SeriousBroker.it  This test is not yet  approved or cleared by the [place] FDA and has been authorized for detection and/or diagnosis of SARS-CoV-2 by FDA under an Emergency Use Authorization (EUA). This EUA will remain in effect (meaning this test can be used) for the duration of the COVID-19 declaration under Section 564(b)(1) of the Act, 21 U.S.C. section 360bbb-3(b)(1), unless the authorization is terminated or revoked.  Performed at [facility], [address], [place], [place] [address]   Blood culture (routine x 2)     Status: None (Preliminary result)   Collection Time: 06/22/21  7:45 PM   Specimen: BLOOD RIGHT ARM  Result Value Ref Range Status   Specimen Description BLOOD RIGHT ARM  Final   Special Requests   Final    BOTTLES DRAWN AEROBIC AND ANAEROBIC Blood Culture adequate volume   Culture   Final    NO GROWTH 3 DAYS Performed at [facility], [address] [place], [place] [address]    Report Status PENDING  Incomplete  Blood culture (routine x 2)     Status: None (Preliminary result)   Collection Time: 06/22/21  7:46 PM   Specimen: BLOOD RIGHT HAND  Result Value Ref Range Status   Specimen Description BLOOD RIGHT HAND  Final   Special Requests   Final    BOTTLES DRAWN AEROBIC AND ANAEROBIC Blood Culture results may not be optimal due to an inadequate volume of blood received in culture bottles   Culture   Final    NO GROWTH 3 DAYS Performed at [facility], [address] [place], [place] [address]    Report Status PENDING  Incomplete    Procedures/Studies: DG Chest 1 View  Result Date: 06/25/2021 CLINICAL DATA:  Metabolic encephalopathy. EXAM: CHEST  1 VIEW COMPARISON:  06/22/2021 FINDINGS: 1141 hours. The cardio pericardial silhouette is enlarged. There is pulmonary vascular congestion without overt pulmonary edema. No focal consolidation or overt pulmonary edema. No pleural effusion. Telemetry leads overlie the chest. IMPRESSION: Enlargement of the cardiopericardial  silhouette with vascular congestion. Electronically Signed   By: Kennith Center M.D.   On: 06/25/2021 12:25   DG Chest 1 View  Result Date: 06/22/2021 CLINICAL DATA:  Not eating for 2 days. EXAM: CHEST  1 VIEW COMPARISON:  Chest x-ray 09/02/2019. FINDINGS: Examination is limited secondary to patient rotation. The cardiomediastinal silhouette is grossly unchanged. The heart is enlarged the aorta is tortuous. There is no lung infiltrate, pleural effusion or pneumothorax. No acute fractures are seen. IMPRESSION: 1. Limited examination.  No definite acute cardiopulmonary process. Electronically Signed   By: Darliss Cheney M.D.   On: 06/22/2021 19:18   CT HEAD WO CONTRAST  Result Date: 06/22/2021 CLINICAL DATA:  Delirium EXAM: CT HEAD WITHOUT CONTRAST TECHNIQUE: Contiguous axial images were obtained from the base of the skull through the vertex without intravenous contrast. COMPARISON:  None. FINDINGS: Brain: Image quality degraded by significant motion. Moderate atrophy. Chronic microvascular ischemic change in  the white matter and right basal ganglia. Negative for acute infarct, hemorrhage, mass Vascular: Negative for hyperdense vessel Skull: Negative Sinuses/Orbits: Negative Other: None IMPRESSION: Motion degraded study.  No acute abnormality Atrophy and chronic ischemia.  Chronic infarct right basal ganglia. Electronically Signed   By: Marlan Palau M.D.   On: 06/22/2021 19:24   CT Renal Stone Study  Result Date: 06/22/2021 CLINICAL DATA:  Flank pain, kidney stone suspected EXAM: CT ABDOMEN AND PELVIS WITHOUT CONTRAST TECHNIQUE: Multidetector CT imaging of the abdomen and pelvis was performed following the standard protocol without IV contrast. COMPARISON:  02/02/2020 FINDINGS: Lower chest: No acute abnormality Hepatobiliary: Calcification noted along the medial margin of the right hepatic lobe is stable since prior study, likely calcified lesion. Low-density lesion in the right hepatic lobe measures 4  cm, stable. Gallbladder unremarkable. Pancreas: No focal abnormality or ductal dilatation. Spleen: No focal abnormality.  Normal size. Adrenals/Urinary Tract: No renal or ureteral stones. No hydronephrosis. Adrenal glands unremarkable. Urinary bladder not well visualized due to beam hardening artifact in the pelvis. Stomach/Bowel: Stomach, large and small bowel grossly unremarkable. Vascular/Lymphatic: Calcified aorta and iliac vessels. No evidence of aneurysm or adenopathy. IVC filter noted within the infrarenal IVC. Reproductive: Numerous calcified fibroids in the uterus. No adnexal mass. Other: No free fluid or free air. Musculoskeletal: Prior right hip replacement. No acute bony abnormality. IMPRESSION: No renal or ureteral stones.  No hydronephrosis. Liver lesions are unchanged and difficult to characterize on this noncontrast study. Aortoiliac atherosclerosis. Fibroid uterus. No acute findings. Electronically Signed   By: Charlett Nose M.D.   On: 06/22/2021 23:42    Labs: BNP (last 3 results) Recent Labs    06/23/21 0207 06/25/21 0209  BNP 1,681.9* 3,771.5*   Basic Metabolic Panel: Recent Labs  Lab 06/22/21 1945 06/22/21 1959 06/23/21 0207 06/24/21 0149 06/25/21 0209 06/26/21 0157  NA 142 144 144 145 150* 152*  K 4.1 4.2 4.0 3.8 3.7 3.7  CL 106  --  112* 114* 118* 120*  CO2 22  --  19* 21* 22 21*  GLUCOSE 135*  --  109* 97 138* 126*  BUN 29*  --  29* 25* 24* 21  CREATININE 1.42*  --  1.29* 1.22* 1.22* 1.14*  CALCIUM 9.8  --  9.2 9.3 9.5 9.3  MG  --   --  2.4  --  2.5*  --    Liver Function Tests: Recent Labs  Lab 06/22/21 1945 06/23/21 0207 06/24/21 0149 06/25/21 0209 06/26/21 0157  AST 20 16 18  38 21  ALT 24 19 16 31 23   ALKPHOS 80 67 65 80 67  BILITOT 2.1* 1.6* 1.9* 1.5* 1.3*  PROT 7.9 6.6 6.5 6.9 6.8  ALBUMIN 3.8 3.1* 3.1* 3.2* 3.1*   No results for input(s): LIPASE, AMYLASE in the last 168 hours. Recent Labs  Lab 06/22/21 1945  AMMONIA 15   CBC: Recent Labs   Lab 06/22/21 1945 06/22/21 1959 06/23/21 0207 06/24/21 0149 06/25/21 0209 06/26/21 0157  WBC 7.3  --  6.3 7.0 6.9 6.4  NEUTROABS 4.7  --  4.2  --   --   --   HGB 15.6* 16.3* 14.2 13.2 13.6 13.9  HCT 47.3* 48.0* 43.5 41.9 43.0 43.5  MCV 93.1  --  94.0 95.0 94.5 94.6  PLT 214  --  167 171 166 168   Cardiac Enzymes: Recent Labs  Lab 06/23/21 0207  CKTOTAL 92   BNP: Invalid input(s): POCBNP CBG: Recent Labs  Lab 06/22/21  1923 06/25/21 2137 06/26/21 0632  GLUCAP 151* 127* 146*   D-Dimer No results for input(s): DDIMER in the last 72 hours. Hgb A1c No results for input(s): HGBA1C in the last 72 hours. Lipid Profile No results for input(s): CHOL, HDL, LDLCALC, TRIG, CHOLHDL, LDLDIRECT in the last 72 hours. Thyroid function studies No results for input(s): TSH, T4TOTAL, T3FREE, THYROIDAB in the last 72 hours.  Invalid input(s): FREET3 Anemia work up No results for input(s): VITAMINB12, FOLATE, FERRITIN, TIBC, IRON, RETICCTPCT in the last 72 hours. Urinalysis    Component Value Date/Time   COLORURINE AMBER (A) 06/22/2021 2151   APPEARANCEUR HAZY (A) 06/22/2021 2151   LABSPEC 1.015 06/22/2021 2151   PHURINE 5.0 06/22/2021 2151   GLUCOSEU NEGATIVE 06/22/2021 2151   HGBUR LARGE (A) 06/22/2021 2151   BILIRUBINUR NEGATIVE 06/22/2021 2151   KETONESUR NEGATIVE 06/22/2021 2151   PROTEINUR 30 (A) 06/22/2021 2151   NITRITE NEGATIVE 06/22/2021 2151   LEUKOCYTESUR MODERATE (A) 06/22/2021 2151   Sepsis Labs Invalid input(s): PROCALCITONIN,  WBC,  LACTICIDVEN Microbiology Recent Results (from the past 240 hour(s))  Resp Panel by RT-PCR (Flu A&B, Covid) Nasopharyngeal Swab     Status: None   Collection Time: 06/22/21  6:39 PM   Specimen: Nasopharyngeal Swab; Nasopharyngeal(NP) swabs in vial transport medium  Result Value Ref Range Status   SARS Coronavirus 2 by RT PCR NEGATIVE NEGATIVE Final    Comment: (NOTE) SARS-CoV-2 target nucleic acids are NOT DETECTED.  The  SARS-CoV-2 RNA is generally detectable in upper respiratory specimens during the acute phase of infection. The lowest concentration of SARS-CoV-2 viral copies this assay can detect is 138 copies/mL. A negative result does not preclude SARS-Cov-2 infection and should not be used as the sole basis for treatment or other patient management decisions. A negative result may occur with  improper specimen collection/handling, submission of specimen other than nasopharyngeal swab, presence of viral mutation(s) within the areas targeted by this assay, and inadequate number of viral copies(<138 copies/mL). A negative result must be combined with clinical observations, patient history, and epidemiological information. The expected result is Negative.  Fact Sheet for Patients:  BloggerCourse.com  Fact Sheet for Healthcare Providers:  SeriousBroker.it  This test is no t yet approved or cleared by the [place] FDA and  has been authorized for detection and/or diagnosis of SARS-CoV-2 by FDA under an Emergency Use Authorization (EUA). This EUA will remain  in effect (meaning this test can be used) for the duration of the COVID-19 declaration under Section 564(b)(1) of the Act, 21 U.S.C.section 360bbb-3(b)(1), unless the authorization is terminated  or revoked sooner.       Influenza A by PCR NEGATIVE NEGATIVE Final   Influenza B by PCR NEGATIVE NEGATIVE Final    Comment: (NOTE) The Xpert Xpress SARS-CoV-2/FLU/RSV plus assay is intended as an aid in the diagnosis of influenza from Nasopharyngeal swab specimens and should not be used as a sole basis for treatment. Nasal washings and aspirates are unacceptable for Xpert Xpress SARS-CoV-2/FLU/RSV testing.  Fact Sheet for Patients: BloggerCourse.com  Fact Sheet for Healthcare Providers: SeriousBroker.it  This test is not yet approved or  cleared by the [place] FDA and has been authorized for detection and/or diagnosis of SARS-CoV-2 by FDA under an Emergency Use Authorization (EUA). This EUA will remain in effect (meaning this test can be used) for the duration of the COVID-19 declaration under Section 564(b)(1) of the Act, 21 U.S.C. section 360bbb-3(b)(1), unless the authorization is terminated or revoked.  Performed at [facility], [address] [place], [place] [address]   Blood culture (routine x 2)     Status: None (Preliminary result)   Collection Time: 06/22/21  7:45 PM   Specimen: BLOOD RIGHT ARM  Result Value Ref Range Status   Specimen Description BLOOD RIGHT ARM  Final   Special Requests   Final    BOTTLES DRAWN AEROBIC AND ANAEROBIC Blood Culture adequate volume   Culture   Final    NO GROWTH 3 DAYS Performed at [facility], [address] [place], [place] [address]    Report Status PENDING  Incomplete  Blood culture (routine x 2)     Status: None (Preliminary result)   Collection Time: 06/22/21  7:46 PM   Specimen: BLOOD RIGHT HAND  Result Value Ref Range Status   Specimen Description BLOOD RIGHT HAND  Final   Special Requests   Final    BOTTLES DRAWN AEROBIC AND ANAEROBIC Blood Culture results may not be optimal due to an inadequate volume of blood received in culture bottles   Culture   Final    NO GROWTH 3 DAYS Performed at [facility], [address] [place], [place] [address]    Report Status PENDING  Incomplete     Time coordinating discharge: 25 minutes  SIGNED: Lanae Boast, MD  [facility] 06/26/2021, 10:56 AM  If 7PM-7AM, please contact night-coverage [email or web address]

## 2021-06-26 NOTE — TOC Transition Note (Signed)
Transition of Care [facility]) - CM/SW Discharge Note   Patient Details  Name: Monique Petersen MRN: [id number] Date of Birth: Jun 16, 1916  [facility] of [facility] CM/SW Contact:  Carley Hammed, LCSWA Phone Number: 06/26/2021, 11:09 AM   Clinical Narrative:    Pt to be transported to [facility] pending Negative covid test via [facility]. [facility] to follow. Nurse to call report to [phone]. Rm# [phone].   Final next level of care: Skilled Nursing Facility Barriers to Discharge: Barriers Resolved   Patient Goals and CMS Choice        Discharge Placement              Patient chooses bed at:  ([facility]) Patient to be transferred to facility by: [facility] Name of family member notified: Mary Patient and family notified of of transfer: 06/26/21  Discharge Plan and Services                                     Social Determinants of Health (SDOH) Interventions     Readmission Risk Interventions No flowsheet data found.

## 2021-06-26 NOTE — Progress Notes (Addendum)
Palliative Medicine Inpatient Follow Up Note  Consulting Provider: Lanae Boast, MD   Reason for consult:   Palliative Care Consult Services Palliative Medicine Consult  Reason for Consult? GOC    HPI:  Per intake H&P --> Monique Petersen, 87 y.o. female with PMH of chronic systolic CHF, GERD, dementia, depression brought from nursing facility with 2 days of progressive somnolence diminished interaction declining oral intake relative to her baseline mental and functional status which she is reportedly alert and able to feed herself. Has been identified to have a UTI which is being treated.    Palliative care has been asked to get involved in the setting of advanced age and chronic co-morbidities to discuss goals of care.  Today's Discussion (06/26/2021):  *Please note that this is a verbal dictation therefore any spelling or grammatical errors are due to the "Dragon Medical One" system interpretation.  Chart reviewed. Patient has taken in little to no PO consistently. Na continues to be elevated despite hydration.   I met at bedside with Monique Petersen this morning, she was more somnolent. I tried to feed her through she would not accept orals. I attempted to get her up though she was too lethargic.   I met at bedside with Dr. Jonathon Bellows at this point he shares hospice would be the best option for care which I;d agree with.  I called patients daughter, Monique Petersen this morning. We reviewed that Monique Petersen does not seem to be recovering from this UTI as well as was hoped. I shared that when you are of advanced age with other chronic conditions it does not take much to cause a rapid decline. I reviewed that hospice is an appropriate next step.  I described hospice as a service for patients for have a life expectancy of < 6 months. It preserves dignity and quality at the end phases of life. The focus changes from curative to symptom relief. Monique Petersen states that she agree's with this. She expressed that she will speak to  her brother to inform them.  As of present we plan to send Monique Petersen back to [facility] with hospice support.  Objective Assessment: Vital Signs Vitals:   06/26/21 0403 06/26/21 0800  BP: 137/84 (!) 149/77  Pulse: 85 85  Resp: 18 (!) 28  Temp: 98.9 F (37.2 C) 98.7 F (37.1 C)  SpO2: 97% 97%    Intake/Output Summary (Last 24 hours) at 06/26/2021 4691 Last data filed at 06/26/2021 6915 Gross per 24 hour  Intake 650.7 ml  Output 550 ml  Net 100.7 ml   Last Weight  Most recent update: 06/26/2021  5:21 AM    Weight  67.8 kg (149 lb 7.6 oz)            Gen:  Elderly AA F in NAD HEENT: Dry mucous membranes CV: Regular rate and rhythm  PULM:  On RA ABD: soft/nontender  EXT: (+) BLE trace edema  Neuro: Somnolent this morning, does awaken when stimulated  SUMMARY OF RECOMMENDATIONS   DNAR/DNI  Placed a gold DNR on the chart   Treat what is treatable   Plan to transition back to [facility] with hospice  Time Spent: 42 Greater than 50% of the time was spent in counseling and coordination of care ______________________________________________________________________________________ Lamarr Lulas [place] Palliative Medicine Team Team Cell Phone: [phone] Please utilize secure chat with additional questions, if there is no response within 30 minutes please call the above phone number  Palliative Medicine Team providers are available by phone from  7am to 7pm daily and can be reached through the team cell phone.  Should this patient require assistance outside of these hours, please call the patient's attending physician.

## 2021-06-27 LAB — CULTURE, BLOOD (ROUTINE X 2)
Culture: NO GROWTH
Culture: NO GROWTH
Special Requests: ADEQUATE

## 2021-07-07 DEATH — deceased

## 2021-07-25 ENCOUNTER — Telehealth: Payer: Self-pay

## 2021-07-25 NOTE — Progress Notes (Deleted)
Cardiology Office Note:    Date:  07/25/2021   ID:  Monique Petersen, DOB 31-Dec-1915, MRN [id number]  PCP:  Charlynne Pander, MD   [facility]  Cardiologist:  Christell Constant, MD  Advanced Practice Provider:  No care team member to display Electrophysiologist:  None      CC: CHF- NOS  Consulted for the evaluation of CHF at the behest of Charlynne Pander, MD  History of Present Illness:    Monique Petersen is a 86 y.o. female with a hx of HTN, CHF nos, and Vascular Dementia who presents for evaluation 11/26/20.  AT last visit, her dementia was significant.  She needed constant re-orienting through visit.  Attempted to reach daughter, reached son Deliah Boston and notes that Daughter makes medical decisions.  Through records review, patient was taking high dose last 120 mg PO BID but with adequate response.  Had admission 06/22/21 where she was found to have prolonged QT.  Palliative care saw her and she was transitioned to hospice.  No chest pain or pressure ***.  No SOB/DOE*** and no PND/Orthopnea***.  No weight gain or leg swelling***.  No palpitations or syncope ***.  Ambulatory blood pressure ***.    Past Medical History:  Diagnosis Date   Anxiety    Anxiety    CHF (congestive heart failure) (HCC)    COVID-19    Dementia (HCC)    Depression    GERD (gastroesophageal reflux disease)    Hypertension    Vascular dementia, uncomplicated (HCC)     No past surgical history on file.  Current Medications: No outpatient medications have been marked as taking for the 07/26/21 encounter (Appointment) with Christell Constant, MD.     Allergies:   Patient has no known allergies.   Social History   Socioeconomic History   Marital status: Single    Spouse name: Not on file   Number of children: Not on file   Years of education: Not on file   Highest education level: Not on file  Occupational History   Not on file  Tobacco Use   Smoking status:  Never   Smokeless tobacco: Never  Substance and Sexual Activity   Alcohol use: Not Currently   Drug use: Never   Sexual activity: Not on file  Other Topics Concern   Not on file  Social History Narrative   Not on file   Social Determinants of Health   Financial Resource Strain: Not on file  Food Insecurity: Not on file  Transportation Needs: Not on file  Physical Activity: Not on file  Stress: Not on file  Social Connections: Not on file     Family History: Unable to obtain- patient has significant dementia and records do not address  ROS:   Please see the history of present illness.     All other systems reviewed and are negative thought with limited understanding by patient.  EKGs/Labs/Other Studies Reviewed:    The following studies were reviewed today:  EKG:   11/25/20: SR rate 93 LVH IVCD  Recent Labs: 06/23/2021: TSH 0.828 06/25/2021: B Natriuretic Peptide 3,771.5; Magnesium 2.5 06/26/2021: ALT 23; BUN 21; Creatinine, Ser 1.14; Hemoglobin 13.9; Platelets 168; Potassium 3.7; Sodium 152  Recent Lipid Panel No results found for: CHOL, TRIG, HDL, CHOLHDL, VLDL, LDLCALC, LDLDIRECT   Physical Exam:    VS:  There were no vitals taken for this visit.    Wt Readings from Last 3 Encounters:  06/26/21 67.8 kg  Gen: *** distress, *** obese/well nourished/malnourished   Neck: No JVD, *** carotid bruit Ears: *** Frank Sign Cardiac: No Rubs or Gallops, *** Murmur, ***cardia, *** radial pulses Respiratory: Clear to auscultation bilaterally, *** effort, ***  respiratory rate GI: Soft, nontender, non-distended *** MS: No *** edema; *** moves all extremities Integument: Skin feels *** Neuro:  At time of evaluation, alert and oriented to person/place/time/situation *** Psych: Normal affect, patient feels ***   ASSESSMENT:    No diagnosis found.  PLAN:     Heart Failure Reduced Ejection Fraction NOS Vascular Dementia HTN - keeping her on lasix 120 mg PO BID  (the does we believe she is on) appears to keep her euvolemic - would check BMP, Mg on this regimen to make sure her kidney WNL  - will not repeat echocardiogram as we have no plans to pursue aggressive therapy given age - If HTN becomes an issues, low threshold for ARB or Coreg - we were able to get claificat  PRN follow up       Medication Adjustments/Labs and Tests Ordered: Current medicines are reviewed at length with the patient today.  Concerns regarding medicines are outlined above.  No orders of the defined types were placed in this encounter.  No orders of the defined types were placed in this encounter.   There are no Patient Instructions on file for this visit.   Signed, Christell Constant, MD  07/25/2021 8:05 AM    [facility]

## 2021-07-25 NOTE — Telephone Encounter (Signed)
Called [facility] where pt resides to cancel 07/26/21 OV.  Was told by scheduler that pt passed away about 3 weeks ago.  This message will be routed to medical records to follow up.

## 2021-07-26 ENCOUNTER — Ambulatory Visit: Payer: Medicare Other | Admitting: Internal Medicine

## 2023-04-16 IMAGING — CT CT HEAD W/O CM
4 series · 17 of 47 positions shown, 19 images · non-contrast
Comparison: None.

CLINICAL DATA: Delirium

EXAM:
CT HEAD WITHOUT CONTRAST
TECHNIQUE: Contiguous axial images were obtained from the base of the skull
through the vertex without intravenous contrast.

[Series 3: head bone · axial · 0.41mm/px · z∈[-152,-74]mm · 5 of 82 slices shown]
[im 8/82  bone]
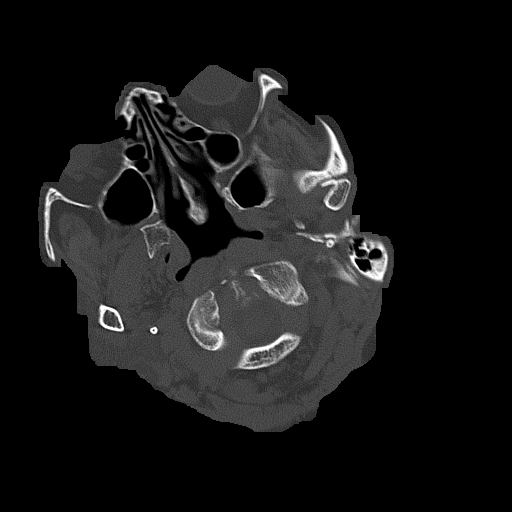
[im 16/82  bone]
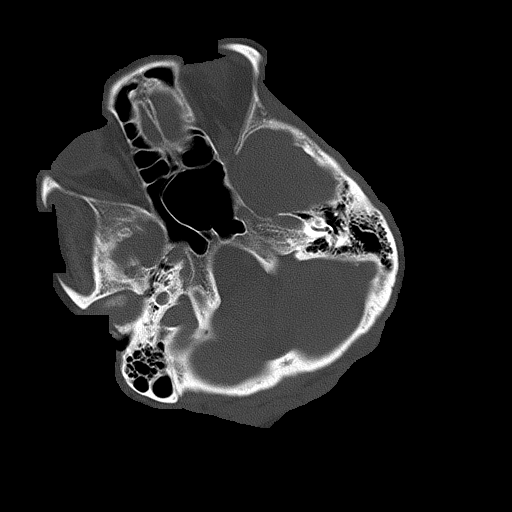
[im 28/82  bone]
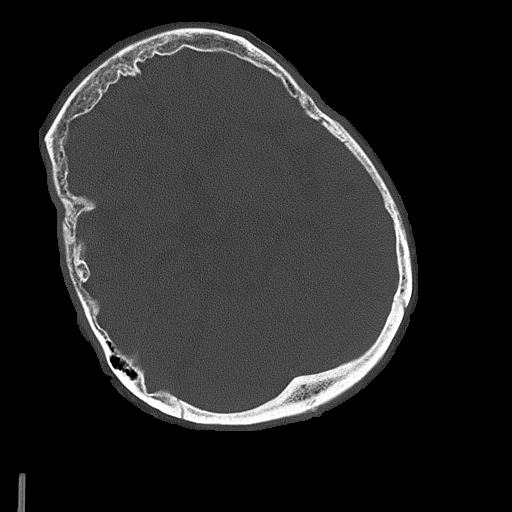
[im 35/82  bone]
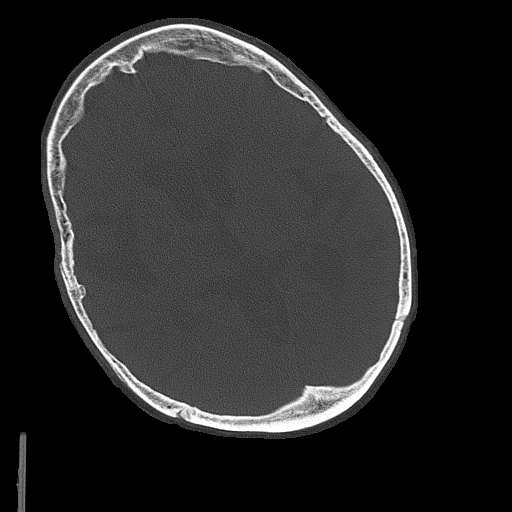
[im 47/82  bone]
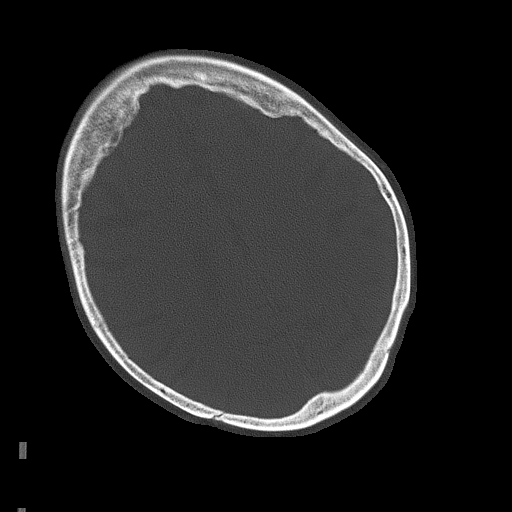

[Series 4: head without ax · axial · non-contrast · 0.41mm/px · z∈[-128,-24]mm · 6 of 31 slices shown, 8 images]
[im 5/31  brain]
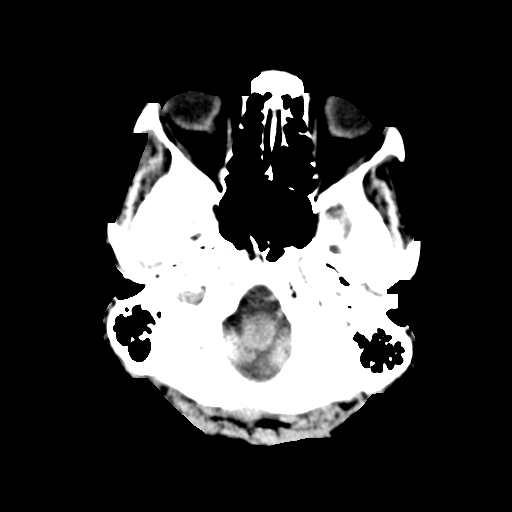
[im 5/31  bone]
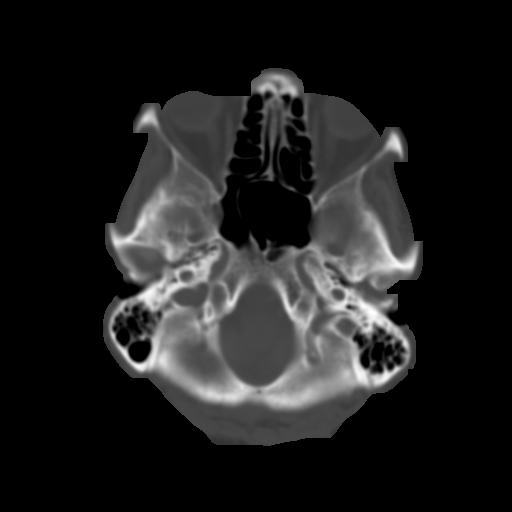
[im 9/31  brain]
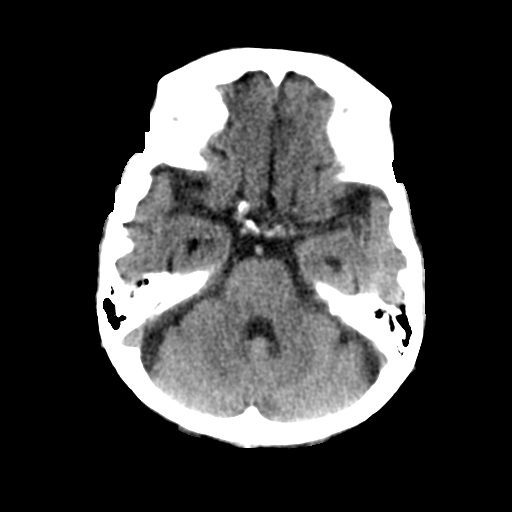
[im 13/31  brain]
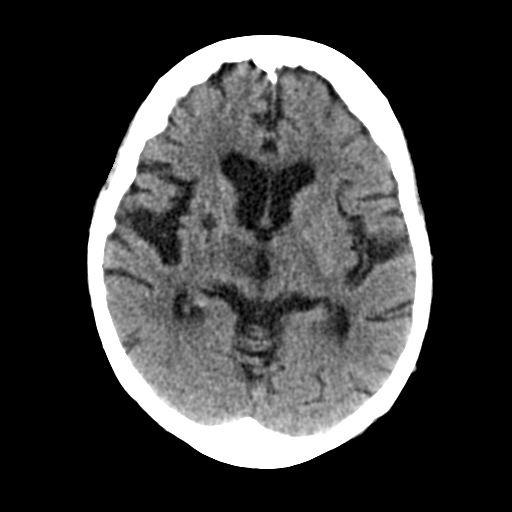
[im 18/31  brain]
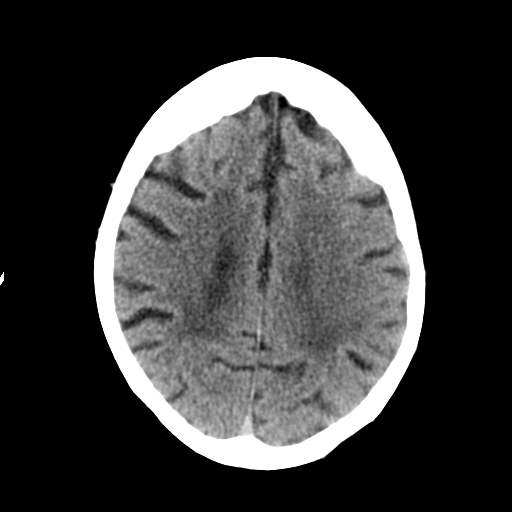
[im 22/31  brain]
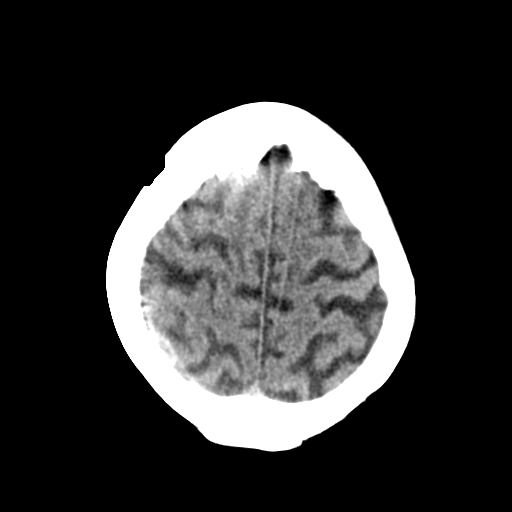
[im 22/31  bone]
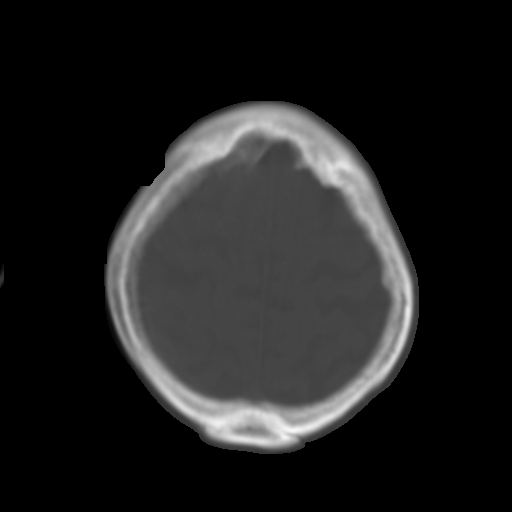
[im 26/31  brain]
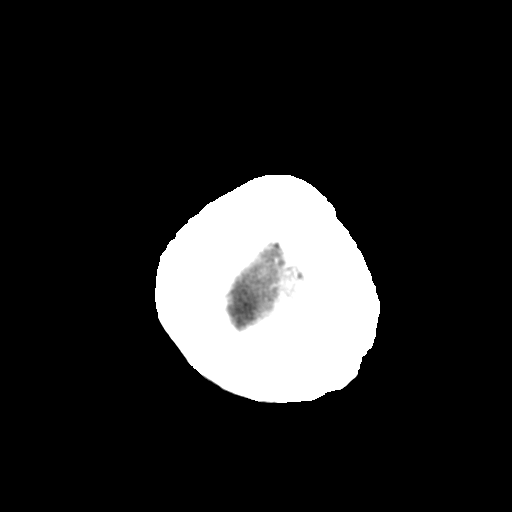

[Series 5: head without cor · coronal · non-contrast · 0.32mm/px · 3 of 67 slices shown]
[im 23/67  brain]
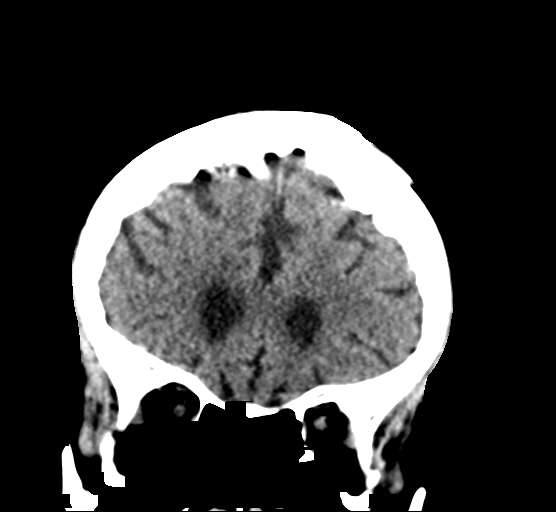
[im 30/67  brain]
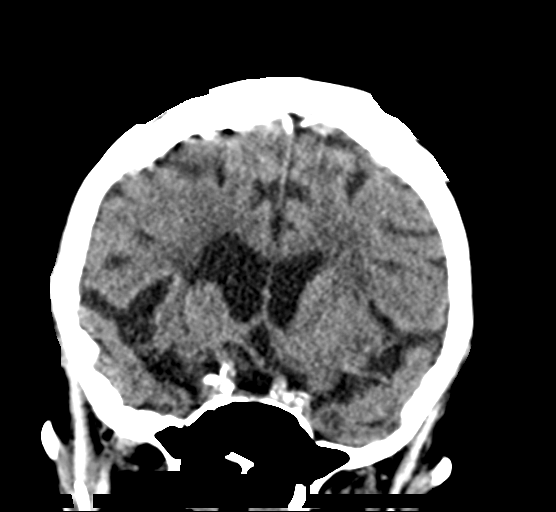
[im 37/67  brain]
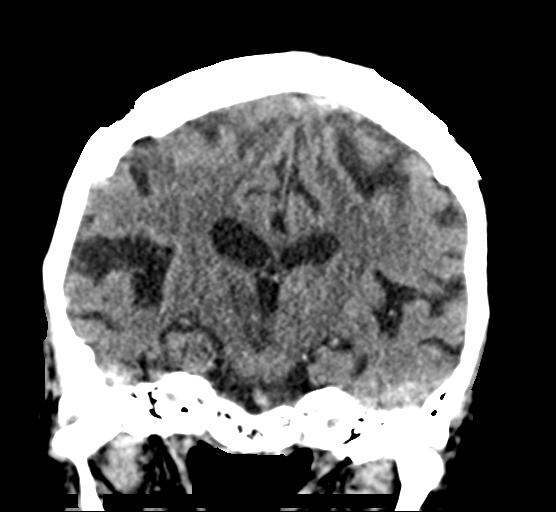

[Series 6: head without sag · sagittal · non-contrast · 0.32mm/px · 3 of 56 slices shown]
[im 19/56  brain]
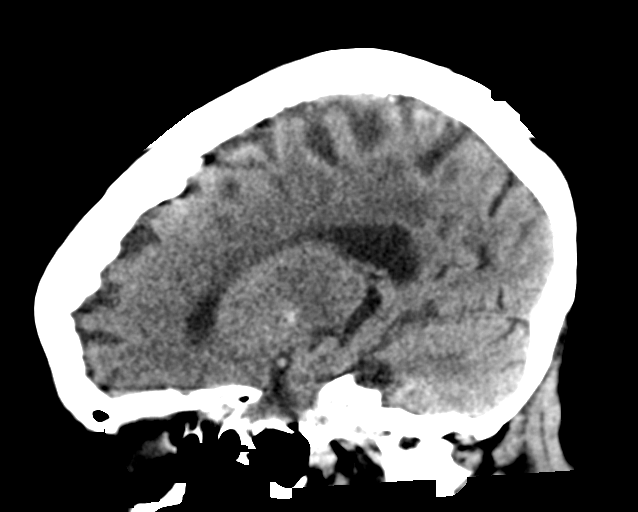
[im 28/56  brain]
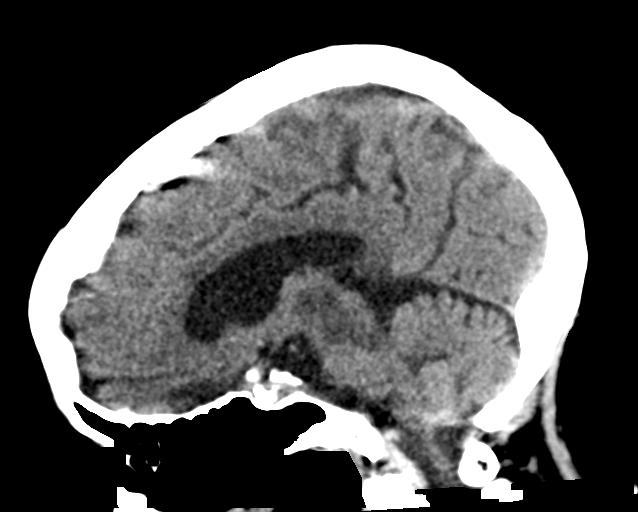
[im 37/56  brain]
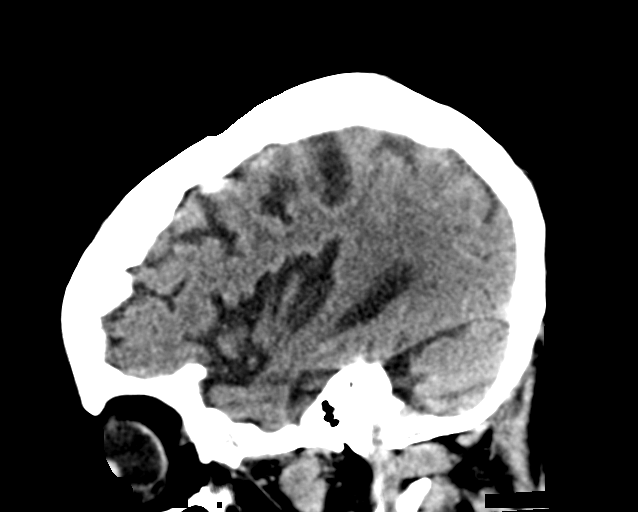

[17 of 47 positions shown; findings below may reference images not displayed]

FINDINGS: Brain: Image quality degraded by significant motion.

Moderate atrophy. Chronic microvascular ischemic change in the white
matter and right basal ganglia.

Negative for acute infarct, hemorrhage, mass

Vascular: Negative for hyperdense vessel

Skull: Negative

Sinuses/Orbits: Negative

Other: None
IMPRESSION: Motion degraded study.  No acute abnormality

Atrophy and chronic ischemia.  Chronic infarct right basal ganglia.

## 2023-04-19 IMAGING — DX DG CHEST 1V
1 series · 1 of 1 positions shown · non-contrast
Comparison: 06/22/2021

CLINICAL DATA: Metabolic encephalopathy.

EXAM:
CHEST  1 VIEW

[chest ap]
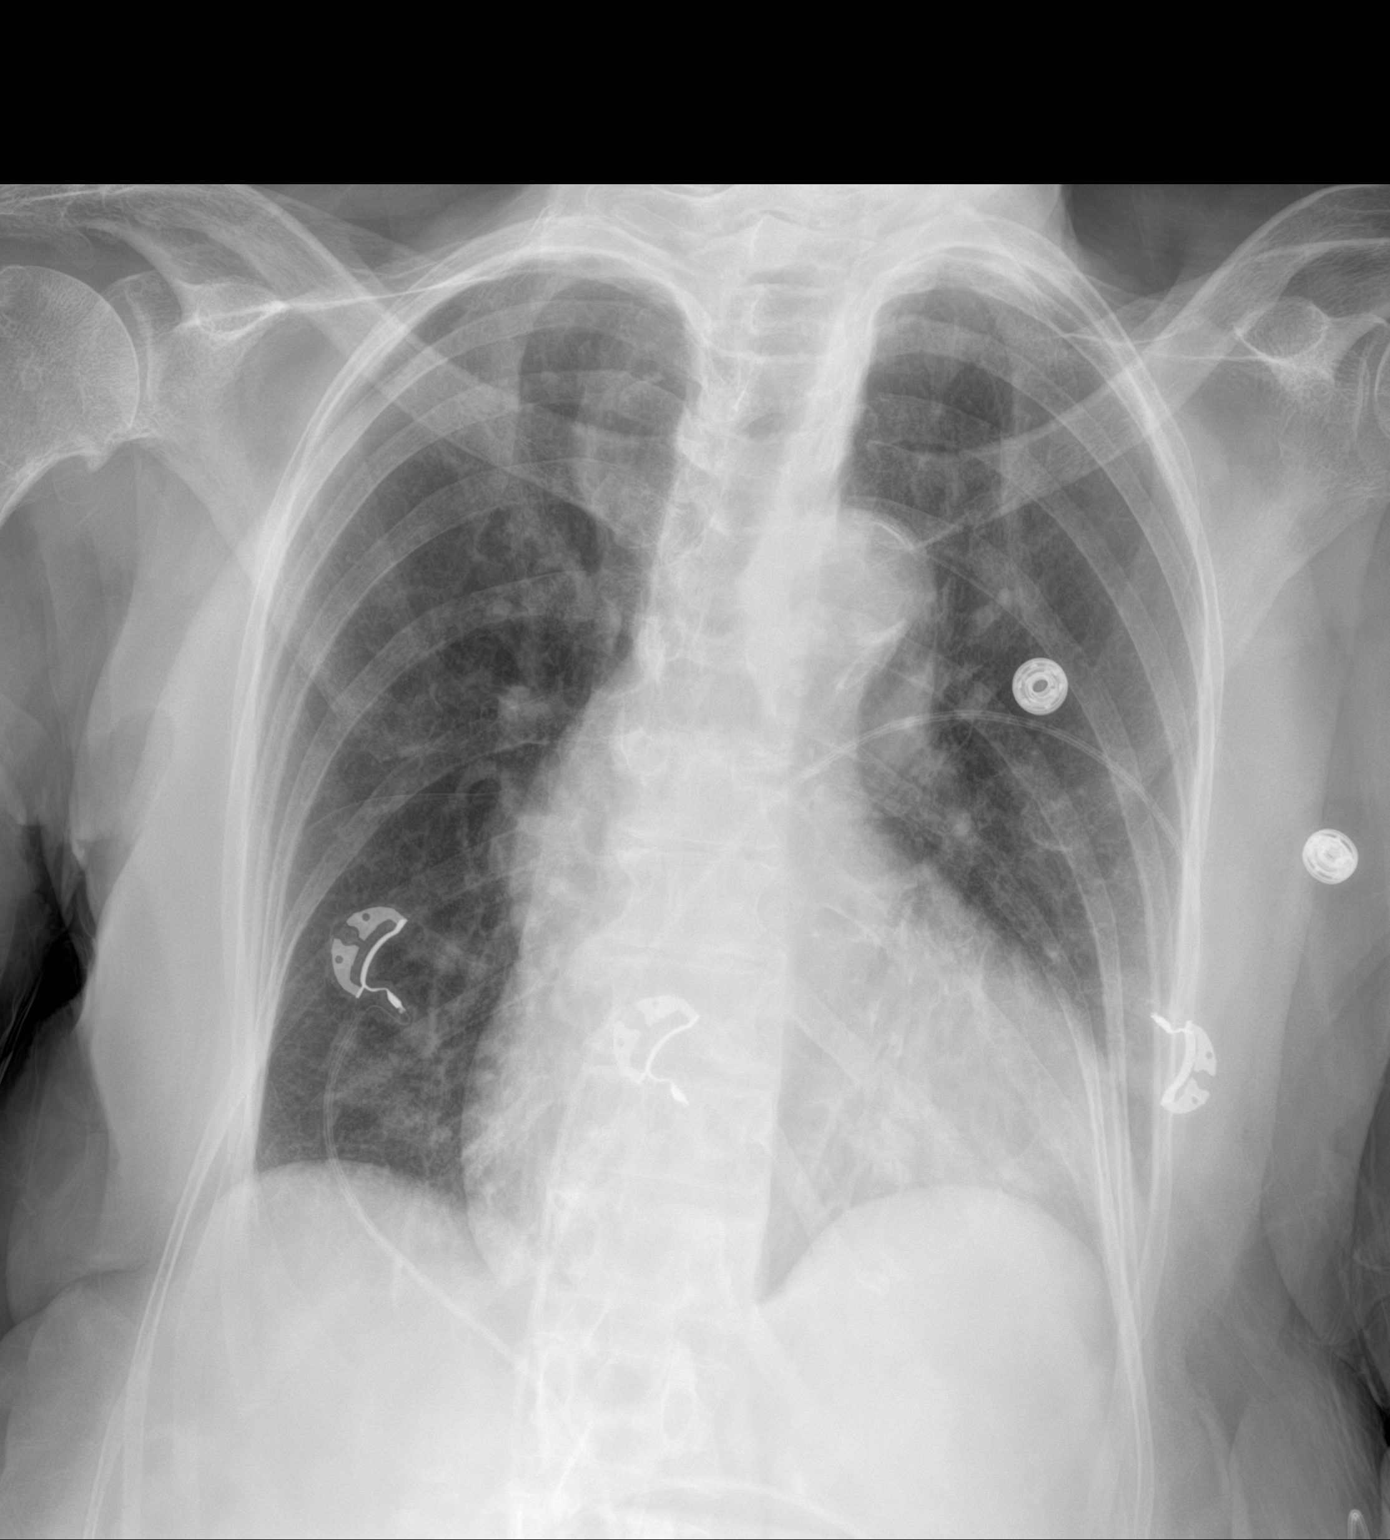

[1 of 1 positions shown; findings below may reference images not displayed]

FINDINGS: 3373 hours. The cardio pericardial silhouette is enlarged. There is
pulmonary vascular congestion without overt pulmonary edema. No
focal consolidation or overt pulmonary edema. No pleural effusion.
Telemetry leads overlie the chest.
IMPRESSION: Enlargement of the cardiopericardial silhouette with vascular
congestion.
